# Patient Record
Sex: Male | Born: 1985
Health system: Southern US, Community
[De-identification: ages and names within clinical notes are randomized; demographics above are authoritative.]

## PROBLEM LIST (undated history)

## (undated) DIAGNOSIS — E119 Type 2 diabetes mellitus without complications: Secondary | ICD-10-CM

## (undated) DIAGNOSIS — I10 Essential (primary) hypertension: Secondary | ICD-10-CM

## (undated) HISTORY — DX: Essential (primary) hypertension: I10

## (undated) HISTORY — DX: Type 2 diabetes mellitus without complications: E11.9

## (undated) HISTORY — DX: Morbid (severe) obesity due to excess calories: E66.01

---
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
## 2013-08-18 ENCOUNTER — Ambulatory Visit (INDEPENDENT_AMBULATORY_CARE_PROVIDER_SITE_OTHER): Payer: BC Managed Care – PPO | Admitting: Family Medicine

## 2013-08-18 VITALS — BP 130/80 | HR 96 | Temp 99.0°F | Resp 16 | Ht 74.5 in | Wt 324.0 lb

## 2013-08-18 DIAGNOSIS — D649 Anemia, unspecified: Secondary | ICD-10-CM

## 2013-08-18 DIAGNOSIS — R112 Nausea with vomiting, unspecified: Secondary | ICD-10-CM

## 2013-08-18 DIAGNOSIS — E663 Overweight: Secondary | ICD-10-CM | POA: Insufficient documentation

## 2013-08-18 DIAGNOSIS — R6883 Chills (without fever): Secondary | ICD-10-CM

## 2013-08-18 DIAGNOSIS — R509 Fever, unspecified: Secondary | ICD-10-CM

## 2013-08-18 DIAGNOSIS — J029 Acute pharyngitis, unspecified: Secondary | ICD-10-CM

## 2013-08-18 LAB — FERRITIN: FERRITIN: 74 ng/mL (ref 22–322)

## 2013-08-18 LAB — COMPREHENSIVE METABOLIC PANEL
ALBUMIN: 4.1 g/dL (ref 3.5–5.2)
ALT: 98 U/L — ABNORMAL HIGH (ref 0–53)
AST: 85 U/L — AB (ref 0–37)
Alkaline Phosphatase: 71 U/L (ref 39–117)
BUN: 13 mg/dL (ref 6–23)
CO2: 25 meq/L (ref 19–32)
Calcium: 9 mg/dL (ref 8.4–10.5)
Chloride: 98 mEq/L (ref 96–112)
Creat: 1.12 mg/dL (ref 0.50–1.35)
GLUCOSE: 255 mg/dL — AB (ref 70–99)
POTASSIUM: 4.2 meq/L (ref 3.5–5.3)
SODIUM: 133 meq/L — AB (ref 135–145)
TOTAL PROTEIN: 7.1 g/dL (ref 6.0–8.3)
Total Bilirubin: 1 mg/dL (ref 0.3–1.2)

## 2013-08-18 LAB — POCT CBC
GRANULOCYTE PERCENT: 73.6 % (ref 37–80)
HCT, POC: 38.3 % — AB (ref 43.5–53.7)
Hemoglobin: 11.1 g/dL — AB (ref 14.1–18.1)
LYMPH, POC: 1.2 (ref 0.6–3.4)
MCH, POC: 17.4 pg — AB (ref 27–31.2)
MCHC: 29 g/dL — AB (ref 31.8–35.4)
MCV: 60.1 fL — AB (ref 80–97)
MID (CBC): 0.3 (ref 0–0.9)
PLATELET COUNT, POC: 231 10*3/uL (ref 142–424)
POC GRANULOCYTE: 4.2 (ref 2–6.9)
POC LYMPH %: 20.7 % (ref 10–50)
POC MID %: 5.7 % (ref 0–12)
RBC: 6.37 M/uL — AB (ref 4.69–6.13)
RDW, POC: 19.1 %
WBC: 5.7 10*3/uL (ref 4.6–10.2)

## 2013-08-18 LAB — POCT INFLUENZA A/B
INFLUENZA A, POC: NEGATIVE
INFLUENZA B, POC: NEGATIVE

## 2013-08-18 MED ORDER — ONDANSETRON HCL 8 MG PO TABS: 8.0000 mg | ORAL_TABLET | Freq: Three times a day (TID) | ORAL | Status: DC | PRN

## 2013-08-18 NOTE — Progress Notes (Addendum)
[facility] [address], [place] [id number] [phone]  Date:  08/18/2013   Name:  Peter HerterWandong Powell   DOB:  01/02/1986   MRN:  [id number]  PCP:  No primary provider on file.    Chief Complaint: Nausea   History of Present Illness:  Peter Powell is a 28 y.o. very pleasant male patient who presents with the following:  Ill for about 5 days- started with chills, fatigue, and fever.  He has noted just a mild dry cough.  Also, yesterday he developed nausea, vomiting and diarrhea.  He states that he has not had much of an appetite ever since the onset of his symptoms. However, he has been able to drink some water.  He did vomit several times yesterday none so far today.  He had a few bouts of diarrhea yesterday but also none today He is also complaining of associated diaphoresis, sore throat, rhinorrhea, diarrhea, mild cough at night, and mild left ear pain. Pt reports having at least 3 episodes of loose bowel before presenting to Urgent Care. He denies any abdominal pain.  Peter CornWandong is generally a healthy person.  He has never been a smoker.  He works at [employer].    There are no active problems to display for this patient.   No past medical history on file.  Past Surgical History  Procedure Laterality Date   Knee surgery Right 2001    screws placed below kneecap    History  Substance Use Topics   Smoking status: Never Smoker    Smokeless tobacco: Not on file   Alcohol Use: Not on file    No family history on file.  No Known Allergies  Medication list has been reviewed and updated.  No current outpatient prescriptions on file prior to visit.   No current facility-administered medications on file prior to visit.    Review of Systems:  As per HPI, otherwise negative.  Physical Examination: Filed Vitals:   08/18/13 1133  BP: 130/80  Pulse: 118  Temp: 100.2 F (37.9 C)  Resp: 16   @VITALS2 @ Body mass index is 41.06 kg/(m^2). Ideal Body  Weight: @FLOWAMB ([phone])@([phone])@  GEN: WDWN, NAD, Non-toxic, A & O x 3, large build HEENT: Atraumatic, Normocephalic. Neck supple. No masses, No LAD. Ears look normal except for some scarring at the TMs. Unable to do a throat swab due to strong gag reflex. Tried several times but he is absolutely unable to tolerate.  Visual exam does not show any exudate or particular erythema.   Ears and Nose: No external deformity. CV: RRR, No M/G/R. No JVD. No thrill. No extra heart sounds. PULM: CTA B, no wheezes, crackles, rhonchi. No retractions. No resp. distress. No accessory muscle use. ABD: S, NT, ND, +BS. No rebound. No HSM. Exam is benign EXTR: No c/c/e NEURO Normal gait.  PSYCH: Normally interactive. Conversant. Not depressed or anxious appearing.  Calm demeanor.   Results for orders placed in visit on 08/18/13  POCT CBC      Result Value Range   WBC 5.7  4.6 - 10.2 K/uL   Lymph, poc 1.2  0.6 - 3.4   POC LYMPH PERCENT 20.7  10 - 50 %L   MID (cbc) 0.3  0 - 0.9   POC MID % 5.7  0 - 12 %M   POC Granulocyte 4.2  2 - 6.9   Granulocyte percent 73.6  37 - 80 %G   RBC 6.37 (*) 4.69 -  6.13 M/uL   Hemoglobin 11.1 (*) 14.1 - 18.1 g/dL   HCT, POC 16.1 (*) 09.6 - 53.7 %   MCV 60.1 (*) 80 - 97 fL   MCH, POC 17.4 (*) 27 - 31.2 pg   MCHC 29.0 (*) 31.8 - 35.4 g/dL   RDW, POC 04.5     Platelet Count, POC 231  142 - 424 K/uL   MPV    0 - 99.8 fL  POCT INFLUENZA A/B      Result Value Range   Influenza A, POC Negative     Influenza B, POC Negative     Discussed starting an IV for hydration; he would like to do so.  Felt much better following 1 liter of IVF.  Pulse returned to normal.   Assessment and Plan: Fever, unspecified - Plan: POCT CBC  Chills - Plan: POCT Influenza A/B  Acute pharyngitis  Nausea and vomiting - Plan: ondansetron (ZOFRAN) 8 MG tablet, Comprehensive metabolic panel  Anemia, unspecified - Plan: POCT CBC, Ferritin  Suspect that Peter Powell has a viral illness.  His visual throat  exam and normal CBC are reassuring that this is not strep. Encouraged rest, hydration, and zofran as needed for nausea.  Will check a CMP.  OOW until sx resolve.   Will check ferritin today as he has microcytic anemia  Signed Abbe Amsterdam, MD  Called 9:30 am on 1/3 to discuss labs urgently. No answer, no VM.  Will try back Reached him at 10:30; asked him to come back in today because we need to look at possible diabetes and test him for acute hepatitis. He agreed to do so

## 2013-08-18 NOTE — Patient Instructions (Signed)
You may have the flu or another viral illness.  Use the zofran as needed for nausea.  Work on drinking plenty of fluids and rest.   If you are not feeling better in the next few days please let me know- sooner if you are worse.   As we discussed you are a bit anemic on today's labs.  Let's recheck this in one month; you can just come by the clinic for a lab visit only to have this done.    I also checked your liver and kidney labs today and will let you know as soon as these results come in.

## 2013-08-19 ENCOUNTER — Other Ambulatory Visit: Payer: Self-pay | Admitting: Family Medicine

## 2013-08-19 ENCOUNTER — Ambulatory Visit (INDEPENDENT_AMBULATORY_CARE_PROVIDER_SITE_OTHER): Payer: BC Managed Care – PPO | Admitting: Family Medicine

## 2013-08-19 VITALS — BP 110/72 | HR 62 | Temp 98.9°F | Resp 18 | Ht 75.5 in | Wt 319.8 lb

## 2013-08-19 DIAGNOSIS — R7309 Other abnormal glucose: Secondary | ICD-10-CM

## 2013-08-19 DIAGNOSIS — R509 Fever, unspecified: Secondary | ICD-10-CM

## 2013-08-19 DIAGNOSIS — D649 Anemia, unspecified: Secondary | ICD-10-CM

## 2013-08-19 DIAGNOSIS — E860 Dehydration: Secondary | ICD-10-CM

## 2013-08-19 DIAGNOSIS — R748 Abnormal levels of other serum enzymes: Secondary | ICD-10-CM

## 2013-08-19 DIAGNOSIS — E119 Type 2 diabetes mellitus without complications: Secondary | ICD-10-CM | POA: Insufficient documentation

## 2013-08-19 LAB — POCT URINALYSIS DIPSTICK
GLUCOSE UA: 500
Glucose, UA: 500
KETONES UA: 80
Ketones, UA: 80
LEUKOCYTES UA: NEGATIVE
Leukocytes, UA: NEGATIVE
NITRITE UA: NEGATIVE
Nitrite, UA: NEGATIVE
PH UA: 5.5
PH UA: 6
PROTEIN UA: 100
RBC UA: NEGATIVE
RBC UA: NEGATIVE
SPEC GRAV UA: 1.02
SPEC GRAV UA: 1.025
Urobilinogen, UA: >=8
Urobilinogen, UA: >=8

## 2013-08-19 LAB — COMPREHENSIVE METABOLIC PANEL
ALBUMIN: 3.9 g/dL (ref 3.5–5.2)
ALK PHOS: 69 U/L (ref 39–117)
ALT: 155 U/L — ABNORMAL HIGH (ref 0–53)
AST: 155 U/L — AB (ref 0–37)
BILIRUBIN TOTAL: 1.1 mg/dL (ref 0.3–1.2)
BUN: 11 mg/dL (ref 6–23)
CALCIUM: 8.8 mg/dL (ref 8.4–10.5)
CO2: 26 mEq/L (ref 19–32)
CREATININE: 1.08 mg/dL (ref 0.50–1.35)
Chloride: 97 mEq/L (ref 96–112)
Glucose, Bld: 245 mg/dL — ABNORMAL HIGH (ref 70–99)
Potassium: 4.1 mEq/L (ref 3.5–5.3)
Sodium: 131 mEq/L — ABNORMAL LOW (ref 135–145)
Total Protein: 6.8 g/dL (ref 6.0–8.3)

## 2013-08-19 LAB — POCT CBC
Granulocyte percent: 73.3 %G (ref 37–80)
HCT, POC: 38.4 % — AB (ref 43.5–53.7)
Hemoglobin: 11.2 g/dL — AB (ref 14.1–18.1)
Lymph, poc: 1 (ref 0.6–3.4)
MCH, POC: 17.5 pg — AB (ref 27–31.2)
MCHC: 29.2 g/dL — AB (ref 31.8–35.4)
MCV: 60 fL — AB (ref 80–97)
MID (cbc): 0.1 (ref 0–0.9)
PLATELET COUNT, POC: 160 10*3/uL (ref 142–424)
POC Granulocyte: 3 (ref 2–6.9)
POC LYMPH PERCENT: 23.2 %L (ref 10–50)
POC MID %: 3.5 % (ref 0–12)
RBC: 6.4 M/uL — AB (ref 4.69–6.13)
RDW, POC: 19.3 %
WBC: 4.1 10*3/uL — AB (ref 4.6–10.2)

## 2013-08-19 LAB — GLUCOSE, POCT (MANUAL RESULT ENTRY)
POC GLUCOSE: 269 mg/dL — AB (ref 70–99)
POC Glucose: 244 mg/dl — AB (ref 70–99)

## 2013-08-19 LAB — POCT GLYCOSYLATED HEMOGLOBIN (HGB A1C): HEMOGLOBIN A1C: 11.3

## 2013-08-19 MED ORDER — INSULIN GLARGINE 100 UNIT/ML ~~LOC~~ SOLN
10.0000 [IU] | Freq: Once | SUBCUTANEOUS | Status: AC
Start: 2013-08-19 — End: 2013-08-19
  Administered 2013-08-19: 10 [IU] via SUBCUTANEOUS

## 2013-08-19 MED ORDER — METFORMIN HCL 500 MG PO TABS: 500.0000 mg | ORAL_TABLET | Freq: Two times a day (BID) | ORAL | Status: DC

## 2013-08-19 MED ORDER — IBUPROFEN 200 MG PO TABS
400.0000 mg | ORAL_TABLET | Freq: Once | ORAL | Status: AC
  Administered 2013-08-19: 400 mg via ORAL

## 2013-08-19 NOTE — Progress Notes (Addendum)
[facility]gent [facility] [address], [name] [address] [phone]  Date:  08/19/2013   Name:  Peter Powell   DOB:  October 08, 1985   MRN:  [id number]  PCP:  No primary provider on file.    Chief Complaint: Follow-up   History of Present Illness:  Fox Salminen is a 28 y.o. very pleasant male patient who presents with the following:  Here today to follow-up illness.  Seen yesterday with apparent GI illness.  He felt better after a liter of IVF and was able to go home.  However overnight his CMP showed elevated glucose and also elevated liver enzymes.  Asked him to come back in to today and he agreed.    He reports that last night he could not sleep due to cramping in his legs No abdominal pain.  He has not had any further vomiting He had a normal stool today, no diarrhea.    He was not aware that he had DM and has never been told that his sugar was high.  He does not have a familiy history of DM as far as he knows.    He was able to drink today- just a little bit.  He has not been abe to eat howver.    Patient Active Problem List   Diagnosis Date Noted  . Overweight 08/18/2013    No past medical history on file.  Past Surgical History  Procedure Laterality Date  . Knee surgery Right 2001    screws placed below kneecap    History  Substance Use Topics  . Smoking status: Never Smoker   . Smokeless tobacco: Not on file  . Alcohol Use: Not on file    No family history on file.  No Known Allergies  Medication list has been reviewed and updated.  Current Outpatient Prescriptions on File Prior to Visit  Medication Sig Dispense Refill  . ondansetron (ZOFRAN) 8 MG tablet Take 1 tablet (8 mg total) by mouth every 8 (eight) hours as needed for nausea or vomiting.  15 tablet  0   No current facility-administered medications on file prior to visit.    Review of Systems:  As per HPI- otherwise negative.   Physical Examination: Filed Vitals:   08/19/13 1230   BP: 120/72  Pulse: 122  Temp: 100.6 F (38.1 C)  Resp: 18   Filed Vitals:   08/19/13 1230  Height: 6' 3.5" (1.918 m)  Weight: 319 lb 12.8 oz (145.06 kg)   Body mass index is 39.43 kg/(m^2). Ideal Body Weight: Weight in (lb) to have BMI = 25: 202.3  GEN: WDWN, NAD, Non-toxic, A & O x 3, obese, mild fever HEENT: Atraumatic, Normocephalic. Neck supple. No masses, No LAD. Ears and Nose: No external deformity. CV: RRR, No M/G/R. No JVD. No thrill. No extra heart sounds. PULM: CTA B, no wheezes, crackles, rhonchi. No retractions. No resp. distress. No accessory muscle use. ABD: S, NT, ND, +BS. No rebound. No HSM. EXTR: No c/c/e NEURO Normal gait.  PSYCH: Normally interactive. Conversant. Not depressed or anxious appearing.  Calm demeanor.   Results for orders placed in visit on 08/19/13  POCT GLYCOSYLATED HEMOGLOBIN (HGB A1C)      Result Value Range   Hemoglobin A1C 11.3    GLUCOSE, POCT (MANUAL RESULT ENTRY)      Result Value Range   POC Glucose 269 (*) 70 - 99 mg/dl  POCT URINALYSIS DIPSTICK      Result Value Range  Color, UA orange     Clarity, UA clear     Glucose, UA 500     Bilirubin, UA mod     Ketones, UA 80     Spec Grav, UA 1.020     Blood, UA neg     pH, UA 5.5     Protein, UA >300     Urobilinogen, UA >=8.0     Nitrite, UA neg     Leukocytes, UA Negative    POCT CBC      Result Value Range   WBC 4.1 (*) 4.6 - 10.2 K/uL   Lymph, poc 1.0  0.6 - 3.4   POC LYMPH PERCENT 23.2  10 - 50 %L   MID (cbc) 0.1  0 - 0.9   POC MID % 3.5  0 - 12 %M   POC Granulocyte 3.0  2 - 6.9   Granulocyte percent 73.3  37 - 80 %G   RBC 6.40 (*) 4.69 - 6.13 M/uL   Hemoglobin 11.2 (*) 14.1 - 18.1 g/dL   HCT, POC 16.138.4 (*) 09.643.5 - 53.7 %   MCV 60.0 (*) 80 - 97 fL   MCH, POC 17.5 (*) 27 - 31.2 pg   MCHC 29.2 (*) 31.8 - 35.4 g/dL   RDW, POC 04.519.3     Platelet Count, POC 160  142 - 424 K/uL   MPV    0 - 99.8 fL  GLUCOSE, POCT (MANUAL RESULT ENTRY)      Result Value Range   POC  Glucose 244 (*) 70 - 99 mg/dl  POCT URINALYSIS DIPSTICK      Result Value Range   Color, UA orange     Clarity, UA cloudy     Glucose, UA 500     Bilirubin, UA mod     Ketones, UA 80     Spec Grav, UA 1.025     Blood, UA neg     pH, UA 6.0     Protein, UA 100     Urobilinogen, UA >=8.0     Nitrite, UA neg     Leukocytes, UA Negative     Given 2 liters of IV saline and 10 units of lantus.  Also 400 mg of po ibuprofen. He felt much better, VS improved.  He wished to go home.  Repeat UA and glucose as per second set of labs above.    Assessment and Plan: Elevated glucose - Plan: POCT glycosylated hemoglobin (Hb A1C), POCT glucose (manual entry), POCT urinalysis dipstick  Elevated liver enzymes - Plan: Comprehensive metabolic panel, Acute Hep Panel & Hep B Surface Ab  Diabetes mellitus, type 2 - Plan: insulin glargine (LANTUS) injection 10 Units, POCT glucose (manual entry), POCT urinalysis dipstick, metFORMIN (GLUCOPHAGE) 500 MG tablet  Fever, unspecified - Plan: ibuprofen (ADVIL,MOTRIN) tablet 400 mg  Anemia, unspecified - Plan: POCT CBC  Long discussion with pt today.  He is ill and also has newly diagnosed DM.  We need to treat his condition with care as he is at increased risk of dehydration.   Will plan to start on oral therapy for DM.  He may need to go to insulin but will try metformin first.  He is likely to need additional dietary potassium to prevent hypokalemia as his hyperglycemia is corrected.  Gave a hand- out detailing potassium rich foods.     Plan close follow-up See patient instructions for more details.   Will nee to get his acute hep panel back  before he returns to work.   Advised ibuprofen NOT tylenol given his elevated liver enzymes  Signed Abbe Amsterdam, MD  Called on 1/4: he is doing ok, is not vomiting or having diarrhea and did eat a little bit.  No fever.  He will come and see me tomorrow for a recheck. However if he is getting worse again today  he will come to see [place] at clinic or go to the ER Add on CK, PT INR and EBV if possible.

## 2013-08-19 NOTE — Patient Instructions (Signed)
You do have diabetes.  It is important for us to get your blood sugar under control.  Please eat a diet lower in sugar/ carbohydrates and higher in vegetables.  Exercise and weight loss are also important.   We are going to start you on metformin for your diabetes.  Take one before bed for the next week, then increase to twice a day.    Drink plenty of water.  Also, some potassium would be a good idea.  You can get potassium in foods as per my handout.     I will call you in the next couple of days with your labs and to check on you.  If you are NOT doing ok or are continuing to run a fever please let me know right away!    Get a glucose meter and start checking your sugar prior to eating in the morning.  Write this number down.   Look at the ADA (american diabetes association) website for lots of good information

## 2013-08-20 ENCOUNTER — Encounter (HOSPITAL_COMMUNITY): Payer: Self-pay | Admitting: Emergency Medicine

## 2013-08-20 ENCOUNTER — Emergency Department (HOSPITAL_COMMUNITY)
Admission: EM | Admit: 2013-08-20 | Discharge: 2013-08-20 | Disposition: A | Discharge status: Home / Self Care | Payer: BC Managed Care – PPO | Attending: Emergency Medicine | Admitting: Emergency Medicine

## 2013-08-20 ENCOUNTER — Emergency Department (HOSPITAL_COMMUNITY): Payer: BC Managed Care – PPO

## 2013-08-20 DIAGNOSIS — R69 Illness, unspecified: Secondary | ICD-10-CM

## 2013-08-20 DIAGNOSIS — Z79899 Other long term (current) drug therapy: Secondary | ICD-10-CM | POA: Insufficient documentation

## 2013-08-20 DIAGNOSIS — J111 Influenza due to unidentified influenza virus with other respiratory manifestations: Secondary | ICD-10-CM

## 2013-08-20 DIAGNOSIS — E119 Type 2 diabetes mellitus without complications: Secondary | ICD-10-CM | POA: Insufficient documentation

## 2013-08-20 HISTORY — DX: Type 2 diabetes mellitus without complications: E11.9

## 2013-08-20 LAB — URINALYSIS, ROUTINE W REFLEX MICROSCOPIC
Glucose, UA: 250 mg/dL — AB
Hgb urine dipstick: NEGATIVE
Ketones, ur: >80 mg/dL — AB
Nitrite: NEGATIVE
Protein, ur: 30 mg/dL — AB
Specific Gravity, Urine: 1.025 (ref 1.005–1.030)
Urobilinogen, UA: 2 mg/dL — ABNORMAL HIGH (ref 0.0–1.0)
pH: 6 (ref 5.0–8.0)

## 2013-08-20 LAB — CBC WITH DIFFERENTIAL/PLATELET
Basophils Absolute: 0 10*3/uL (ref 0.0–0.1)
Basophils Relative: 2 % — ABNORMAL HIGH (ref 0–1)
Eosinophils Absolute: 0 10*3/uL (ref 0.0–0.7)
Eosinophils Relative: 0 % (ref 0–5)
HCT: 37.2 % — ABNORMAL LOW (ref 39.0–52.0)
Hemoglobin: 11.2 g/dL — ABNORMAL LOW (ref 13.0–17.0)
Lymphocytes Relative: 19 % (ref 12–46)
Lymphs Abs: 0.4 10*3/uL — ABNORMAL LOW (ref 0.7–4.0)
MCH: 17.1 pg — ABNORMAL LOW (ref 26.0–34.0)
MCHC: 30.1 g/dL (ref 30.0–36.0)
MCV: 56.8 fL — ABNORMAL LOW (ref 78.0–100.0)
Monocytes Absolute: 0.2 10*3/uL (ref 0.1–1.0)
Monocytes Relative: 7 % (ref 3–12)
Neutro Abs: 1.6 10*3/uL — ABNORMAL LOW (ref 1.7–7.7)
Neutrophils Relative %: 72 % (ref 43–77)
Platelets: 121 10*3/uL — ABNORMAL LOW (ref 150–400)
RBC: 6.55 MIL/uL — ABNORMAL HIGH (ref 4.22–5.81)
RDW: 17.6 % — ABNORMAL HIGH (ref 11.5–15.5)
WBC: 2.3 10*3/uL — ABNORMAL LOW (ref 4.0–10.5)

## 2013-08-20 LAB — ACUTE HEP PANEL AND HEP B SURFACE AB
HCV Ab: NEGATIVE
HEP B S AB: POSITIVE — AB
Hep A IgM: NONREACTIVE
Hep B C IgM: NONREACTIVE
Hepatitis B Surface Ag: NEGATIVE

## 2013-08-20 LAB — COMPREHENSIVE METABOLIC PANEL
ALT: 202 U/L — ABNORMAL HIGH (ref 0–53)
AST: 224 U/L — ABNORMAL HIGH (ref 0–37)
Albumin: 3.6 g/dL (ref 3.5–5.2)
Alkaline Phosphatase: 87 U/L (ref 39–117)
BUN: 11 mg/dL (ref 6–23)
CO2: 22 mEq/L (ref 19–32)
Calcium: 8.9 mg/dL (ref 8.4–10.5)
Chloride: 94 mEq/L — ABNORMAL LOW (ref 96–112)
Creatinine, Ser: 0.89 mg/dL (ref 0.50–1.35)
GFR calc Af Amer: >90 mL/min (ref >90)
GFR calc non Af Amer: >90 mL/min (ref >90)
Glucose, Bld: 201 mg/dL — ABNORMAL HIGH (ref 70–99)
Potassium: 4.3 mEq/L (ref 3.7–5.3)
Sodium: 132 mEq/L — ABNORMAL LOW (ref 137–147)
Total Bilirubin: 1.2 mg/dL (ref 0.3–1.2)
Total Protein: 7.4 g/dL (ref 6.0–8.3)

## 2013-08-20 LAB — GLUCOSE, CAPILLARY
GLUCOSE-CAPILLARY: 185 mg/dL — AB (ref 70–99)
GLUCOSE-CAPILLARY: 173 mg/dL — AB (ref 70–99)

## 2013-08-20 LAB — BLOOD GAS, VENOUS
Acid-Base Excess: 0.1 mmol/L (ref 0.0–2.0)
Bicarbonate: 24.8 mEq/L — ABNORMAL HIGH (ref 20.0–24.0)
FIO2: 0.21 %
O2 Saturation: 50.8 %
Patient temperature: 98.6
TCO2: 23.2 mmol/L (ref 0–100)
pCO2, Ven: 43.3 mmHg — ABNORMAL LOW (ref 45.0–50.0)
pH, Ven: 7.377 — ABNORMAL HIGH (ref 7.250–7.300)
pO2, Ven: 30.4 mmHg (ref 30.0–45.0)

## 2013-08-20 LAB — URINE MICROSCOPIC-ADD ON

## 2013-08-20 MED ORDER — ONDANSETRON HCL 4 MG/2ML IJ SOLN
4.0000 mg | Freq: Once | INTRAMUSCULAR | Status: AC
  Administered 2013-08-20: 4 mg via INTRAVENOUS
  Filled 2013-08-20: qty 2

## 2013-08-20 MED ORDER — SODIUM CHLORIDE 0.9 % IV BOLUS (SEPSIS)
2000.0000 mL | Freq: Once | INTRAVENOUS | Status: AC
  Administered 2013-08-20: 2000 mL via INTRAVENOUS

## 2013-08-20 MED ORDER — PROMETHAZINE HCL 25 MG PO TABS: 25.0000 mg | ORAL_TABLET | Freq: Three times a day (TID) | ORAL | Status: DC | PRN

## 2013-08-20 NOTE — ED Notes (Signed)
Pt received call from UC and was told to come here because he was sweating. Pt newly dx diabetic, has had stomach upset for past few days. States he has nausea, but no vomiting.

## 2013-08-20 NOTE — Discharge Instructions (Signed)
Followup with the clinic provided.  Return here as needed. slowly increase your fluid intake

## 2013-08-20 NOTE — ED Provider Notes (Signed)
CSN: [id number]     Arrival date & time 08/20/13  1419 History   First MD Initiated Contact with Patient 08/20/13 1513     Chief Complaint  Patient presents with  . Nausea   (Consider location/radiation/quality/duration/timing/severity/associated sxs/prior Treatment) HPI Patient presents emergency department with body aches, cough, nausea, fever, and sore throat.  The patient, states, that he found out that he was recently a new onset diabetic.  Patient, states, that he doesn't have any chest pain, shortness of breath, abdominal pain, back pain, headache, blurred vision, weakness, dizziness, lethargy, or syncope.  The patient, states, that he did not take any medications although once he was prescribed.  The patient  states, that he's been sweating over the last few hours, and that's what brings him to the emergency room Past Medical History  Diagnosis Date  . Diabetes mellitus without complication    Past Surgical History  Procedure Laterality Date  . Knee surgery Right 2001    screws placed below kneecap   History reviewed. No pertinent family history. History  Substance Use Topics  . Smoking status: Never Smoker   . Smokeless tobacco: Not on file  . Alcohol Use: Not on file    Review of Systems All other systems negative except as documented in the HPI. All pertinent positives and negatives as reviewed in the HPI. Allergies  Review of patient's allergies indicates no known allergies.  Home Medications   Current Outpatient Rx  Name  Route  Sig  Dispense  Refill  . ibuprofen (ADVIL,MOTRIN) 200 MG tablet   Oral   Take 400 mg by mouth every 6 (six) hours as needed.         . metFORMIN (GLUCOPHAGE) 500 MG tablet   Oral   Take 500 mg by mouth 2 (two) times daily with a meal. Start with one pill before bed for the first week         . ondansetron (ZOFRAN) 8 MG tablet   Oral   Take 8 mg by mouth every 8 (eight) hours as needed for nausea or vomiting.          BP  131/83  Pulse 106  Temp(Src) 98 F (36.7 C) (Oral)  Resp 20  SpO2 100% Physical Exam  Nursing note and vitals reviewed. Constitutional: He is oriented to person, place, and time. He appears well-developed and well-nourished. No distress.  HENT:  Head: Normocephalic and atraumatic.  Mouth/Throat: Oropharynx is clear and moist.  Eyes: Pupils are equal, round, and reactive to light.  Neck: Normal range of motion. Neck supple.  Cardiovascular: Normal rate, regular rhythm and normal heart sounds.  Exam reveals no gallop and no friction rub.   No murmur heard. Pulmonary/Chest: Effort normal. No respiratory distress.  Abdominal: Soft. Bowel sounds are normal. There is no tenderness. There is no rebound and no guarding.  Neurological: He is alert and oriented to person, place, and time. No cranial nerve deficit. He exhibits normal muscle tone. Coordination normal.  Skin: Skin is warm and dry. No rash noted. No erythema.    ED Course  Procedures (including critical care time) Labs Review Labs Reviewed  GLUCOSE, CAPILLARY - Abnormal; Notable for the following:    Glucose-Capillary 185 (*)    All other components within normal limits  COMPREHENSIVE METABOLIC PANEL - Abnormal; Notable for the following:    Sodium 132 (*)    Chloride 94 (*)    Glucose, Bld 201 (*)    AST 224 (*)  ALT 202 (*)    All other components within normal limits  CBC WITH DIFFERENTIAL - Abnormal; Notable for the following:    WBC 2.3 (*)    RBC 6.55 (*)    Hemoglobin 11.2 (*)    HCT 37.2 (*)    MCV 56.8 (*)    MCH 17.1 (*)    RDW 17.6 (*)    Platelets 121 (*)    Neutro Abs 1.6 (*)    Lymphs Abs 0.4 (*)    Basophils Relative 2 (*)    All other components within normal limits  URINALYSIS, ROUTINE W REFLEX MICROSCOPIC   Imaging Review Dg Chest 2 View  08/20/2013   CLINICAL DATA:  Cough.  EXAM: CHEST  2 VIEW  COMPARISON:  12/24/2003.  FINDINGS: The cardiac silhouette, mediastinal and hilar contours are  normal and stable. There is peribronchial thickening and slight increased interstitial markings suggesting bronchitis. No infiltrates or effusions. The bony thorax is intact.  IMPRESSION: Findings suggest bronchitis.  No focal infiltrates.   Electronically Signed   By: Loralie Champagne M.D.   On: 08/20/2013 16:18   Patient has improved following IV fluids and has tolerated oral fluids.  Is not in DKA, at this time.  Patient has improved, in the emergency department and his vital signs have improved.  Told to return here as needed.  Given him a referral to the wellness Center as well.  Told to slowly increase his fluid intake    Carlyle Dolly, PA-C 08/21/13 0041

## 2013-08-20 NOTE — ED Notes (Signed)
Pt did not present to ED on 1/4 with a peripheral IV.

## 2013-08-21 LAB — CK: CK TOTAL: 150 U/L (ref 7–232)

## 2013-08-22 LAB — EPSTEIN-BARR VIRUS VCA ANTIBODY PANEL
EBV NA IGG: 154 U/mL — AB (ref <18.0)
EBV VCA IgG: 82.3 U/mL — ABNORMAL HIGH (ref <18.0)

## 2013-08-23 ENCOUNTER — Ambulatory Visit: Payer: BC Managed Care – PPO

## 2013-08-23 ENCOUNTER — Ambulatory Visit (INDEPENDENT_AMBULATORY_CARE_PROVIDER_SITE_OTHER): Payer: BC Managed Care – PPO | Admitting: Family Medicine

## 2013-08-23 VITALS — BP 120/72 | HR 90 | Temp 98.0°F | Resp 18 | Ht 76.0 in | Wt 317.0 lb

## 2013-08-23 DIAGNOSIS — D72819 Decreased white blood cell count, unspecified: Secondary | ICD-10-CM

## 2013-08-23 DIAGNOSIS — M25561 Pain in right knee: Secondary | ICD-10-CM

## 2013-08-23 DIAGNOSIS — R748 Abnormal levels of other serum enzymes: Secondary | ICD-10-CM

## 2013-08-23 DIAGNOSIS — M25569 Pain in unspecified knee: Secondary | ICD-10-CM

## 2013-08-23 LAB — COMPREHENSIVE METABOLIC PANEL
ALBUMIN: 3.7 g/dL (ref 3.5–5.2)
ALK PHOS: 138 U/L — AB (ref 39–117)
ALT: 166 U/L — ABNORMAL HIGH (ref 0–53)
AST: 122 U/L — AB (ref 0–37)
BILIRUBIN TOTAL: 0.9 mg/dL (ref 0.3–1.2)
BUN: 9 mg/dL (ref 6–23)
CALCIUM: 9.2 mg/dL (ref 8.4–10.5)
CO2: 27 mEq/L (ref 19–32)
Chloride: 97 mEq/L (ref 96–112)
Creat: 0.82 mg/dL (ref 0.50–1.35)
Glucose, Bld: 153 mg/dL — ABNORMAL HIGH (ref 70–99)
Potassium: 4.1 mEq/L (ref 3.5–5.3)
Sodium: 132 mEq/L — ABNORMAL LOW (ref 135–145)
TOTAL PROTEIN: 6.9 g/dL (ref 6.0–8.3)

## 2013-08-23 LAB — POCT CBC
Granulocyte percent: 49.8 %G (ref 37–80)
HCT, POC: 33.2 % — AB (ref 43.5–53.7)
Hemoglobin: 11 g/dL — AB (ref 14.1–18.1)
LYMPH, POC: 3.1 (ref 0.6–3.4)
MCH: 17.1 pg — AB (ref 27–31.2)
MCHC: 28.9 g/dL — AB (ref 31.8–35.4)
MCV: 58.9 fL — AB (ref 80–97)
MID (cbc): 0.8 (ref 0–0.9)
PLATELET COUNT, POC: 182 10*3/uL (ref 142–424)
POC GRANULOCYTE: 3.9 (ref 2–6.9)
POC LYMPH PERCENT: 39.5 %L (ref 10–50)
POC MID %: 10.7 %M (ref 0–12)
RBC: 5.63 M/uL (ref 4.69–6.13)
RDW, POC: 20.1 %
WBC: 7.8 10*3/uL (ref 4.6–10.2)

## 2013-08-23 NOTE — ED Provider Notes (Signed)
Medical screening examination/treatment/procedure(s) were performed by non-physician practitioner and as supervising physician I was immediately available for consultation/collaboration.    Peter KrasJon R Sundi Slevin, MD 08/23/13 [phone]

## 2013-08-23 NOTE — Progress Notes (Addendum)
Urgent Medical and [facility] [address], [name] [address] [phone]  Date:  08/23/2013   Name:  Peter Powell   DOB:  06/10/1986   MRN:  [id number]  PCP:  No primary provider on file.    Chief Complaint: Follow-up   History of Present Illness:  Peter Powell is a 28 y.o. very pleasant male patient who presents with the following:  Here today for a recheck of recent illness with elelvated transaminases and new dx of DM.  Here today for a recheck.  He has been seen here twice and then went to the ER 2 days ago.  He is doing better overall he thinks.  He is eating again.   He has not noted a fever but is taking ibuprofen still So far today he ate an egg and some green beans.  He has had water and milk to drink.   No vomiting or diarrhea.    He has also noted pain in his right leg/ knee for the last 3 or 4 weeks.  He hurts in the knee and behind the knee.   He had surgery about 15 years ago for an injury in which he "chipped my knee."  He fell and then had to have a surgical repair and pins.  He is not aware of any new injury to his knee. However he has pain with walking and has to hold his knee as straight as possible to walk,    Patient Active Problem List   Diagnosis Date Noted  . Diabetes mellitus, type 2 08/19/2013  . Overweight 08/18/2013    Past Medical History  Diagnosis Date  . Diabetes mellitus without complication     Past Surgical History  Procedure Laterality Date  . Knee surgery Right 2001    screws placed below kneecap    History  Substance Use Topics  . Smoking status: Never Smoker   . Smokeless tobacco: Not on file  . Alcohol Use: Not on file    History reviewed. No pertinent family history.  No Known Allergies  Medication list has been reviewed and updated.  Current Outpatient Prescriptions on File Prior to Visit  Medication Sig Dispense Refill  . ibuprofen (ADVIL,MOTRIN) 200 MG tablet Take 400 mg by mouth every 6 (six) hours as needed.       . metFORMIN (GLUCOPHAGE) 500 MG tablet Take 500 mg by mouth 2 (two) times daily with a meal. Start with one pill before bed for the first week      . ondansetron (ZOFRAN) 8 MG tablet Take 8 mg by mouth every 8 (eight) hours as needed for nausea or vomiting.      . promethazine (PHENERGAN) 25 MG tablet Take 1 tablet (25 mg total) by mouth every 8 (eight) hours as needed for nausea or vomiting.  15 tablet  0   No current facility-administered medications on file prior to visit.    Review of Systems:  As per HPI- otherwise negative.   Physical Examination: Filed Vitals:   08/23/13 1320  BP: 120/72  Pulse: 119  Temp: 98 F (36.7 C)  Resp: 18   Filed Vitals:   08/23/13 1320  Height: 6\' 4"  (1.93 m)  Weight: 317 lb (143.79 kg)   Body mass index is 38.6 kg/(m^2). Ideal Body Weight: Weight in (lb) to have BMI = 25: 205  GEN: WDWN, NAD, Non-toxic, A & O x 3, obese, looks well HEENT: Atraumatic, Normocephalic. Neck supple. No masses, No LAD.  Ears and Nose: No external deformity. CV: RRR, No M/G/R. No JVD. No thrill. No extra heart sounds. PULM: CTA B, no wheezes, crackles, rhonchi. No retractions. No resp. distress. No accessory muscle use. ABD: S, NT, ND, +BS. No rebound. No HSM. EXTR: No c/c/e NEURO Normal gait. He holds knee straight to walk but this is not apparent from observing his gait.   PSYCH: Normally interactive. Conversant. Not depressed or anxious appearing.  Calm demeanor.  Right knee: he has an anterior scar from past operation.   He has pain with flexion and extension of his knee.  No effusion, knee is stable.  No heat or redness.    Results for orders placed in visit on 08/23/13  POCT CBC      Result Value Range   WBC 7.8  4.6 - 10.2 K/uL   Lymph, poc 3.1  0.6 - 3.4   POC LYMPH PERCENT 39.5  10 - 50 %L   MID (cbc) 0.8  0 - 0.9   POC MID % 10.7  0 - 12 %M   POC Granulocyte 3.9  2 - 6.9   Granulocyte percent 49.8  37 - 80 %G   RBC 5.63  4.69 - 6.13 M/uL    Hemoglobin 11.0 (*) 14.1 - 18.1 g/dL   HCT, POC 16.133.2 (*) 09.643.5 - 53.7 %   MCV 58.9 (*) 80 - 97 fL   MCH, POC 17.1 (*) 27 - 31.2 pg   MCHC 28.9 (*) 31.8 - 35.4 g/dL   RDW, POC 04.520.1     Platelet Count, POC 182  142 - 424 K/uL   MPV    0 - 99.8 fL    [facility] reading (PRIMARY) by  Dr. Patsy Lager[place]. Right knee: post- surgical changes.  Degenerative change and possible loose body   RIGHT KNEE - COMPLETE 4+ VIEW  COMPARISON: None.  FINDINGS: There is tricompartmental osteoarthritis with a slight lateral patellar subluxation and tilt. There are tricompartmental marginal osteophytes, most prominent in the lateral compartment. Incidental note is made of a fabella.  Small joint effusion.  The patient has 4 screws in the proximal tibia.  IMPRESSION: Tricompartmental osteoarthritis. Small joint effusion.  Assessment and Plan: Elevated liver enzymes - Plan: Comprehensive metabolic panel  Leukopenia - Plan: POCT CBC  Knee pain, acute, right - Plan: DG Knee Complete 4 Views Right  luekopenia is resolved.  Await other labs and will call him He is on treatment for his DM with metformin Will send to ortho to evaluate his knee.  We did not have a knee brace to fit him unfortunately.   appt for next week at [facility]  Signed Abbe AmsterdamJessica Harun Brumley, MD  Called and spoke with him on 1/8: his labs are going in the right direction  Results for orders placed in visit on 08/23/13  COMPREHENSIVE METABOLIC PANEL      Result Value Range   Sodium 132 (*) 135 - 145 mEq/L   Potassium 4.1  3.5 - 5.3 mEq/L   Chloride 97  96 - 112 mEq/L   CO2 27  19 - 32 mEq/L   Glucose, Bld 153 (*) 70 - 99 mg/dL   BUN 9  6 - 23 mg/dL   Creat 4.090.82  8.110.50 - 9.141.35 mg/dL   Total Bilirubin 0.9  0.3 - 1.2 mg/dL   Alkaline Phosphatase 138 (*) 39 - 117 U/L   AST 122 (*) 0 - 37 U/L   ALT 166 (*) 0 - 53 U/L  Total Protein 6.9  6.0 - 8.3 g/dL   Albumin 3.7  3.5 - 5.2 g/dL   Calcium 9.2  8.4 - 16.1 mg/dL  POCT CBC      Result  Value Range   WBC 7.8  4.6 - 10.2 K/uL   Lymph, poc 3.1  0.6 - 3.4   POC LYMPH PERCENT 39.5  10 - 50 %L   MID (cbc) 0.8  0 - 0.9   POC MID % 10.7  0 - 12 %M   POC Granulocyte 3.9  2 - 6.9   Granulocyte percent 49.8  37 - 80 %G   RBC 5.63  4.69 - 6.13 M/uL   Hemoglobin 11.0 (*) 14.1 - 18.1 g/dL   HCT, POC 09.6 (*) 04.5 - 53.7 %   MCV 58.9 (*) 80 - 97 fL   MCH, POC 17.1 (*) 27 - 31.2 pg   MCHC 28.9 (*) 31.8 - 35.4 g/dL   RDW, POC 40.9     Platelet Count, POC 182  142 - 424 K/uL   MPV    0 - 99.8 fL   Plan to set up an abdominal ultrasound to look at his liver (?fatty liver) and recheck labs as a lab visit only in 2 or 3 weeks.  He reports that he is feeling well.

## 2013-08-23 NOTE — Patient Instructions (Signed)
I will give you a call with your labs later today.   We will get you in to see orthopedics about your knee.   Let me know if you are getting worse!

## 2013-08-25 ENCOUNTER — Encounter: Payer: Self-pay | Admitting: Family Medicine

## 2013-08-25 NOTE — Addendum Note (Signed)
Addended by: Abbe AmsterdamOPLAND, JESSICA C on: 08/25/2013 07:59 AM   Modules accepted: Orders

## 2013-08-31 ENCOUNTER — Telehealth: Payer: Self-pay | Admitting: Family Medicine

## 2013-08-31 NOTE — Telephone Encounter (Signed)
Called to check on him.  He is feeling well.  Ultrasound is tomorrow.  Asked him to please come by for repeat labs next week and he agreed.  A lab visit only is ok

## 2013-09-01 ENCOUNTER — Ambulatory Visit
Admission: RE | Admit: 2013-09-01 | Discharge: 2013-09-01 | Disposition: A | Discharge status: Home / Self Care | Payer: BC Managed Care – PPO | Source: Ambulatory Visit | Attending: Family Medicine | Admitting: Family Medicine

## 2013-09-01 DIAGNOSIS — R748 Abnormal levels of other serum enzymes: Secondary | ICD-10-CM

## 2013-09-02 ENCOUNTER — Telehealth: Payer: Self-pay | Admitting: Family Medicine

## 2013-09-02 NOTE — Telephone Encounter (Signed)
Called and let him know that ultrasound showed just fat in his liver.  He will have repeat LFTs in the next few days

## 2013-09-05 ENCOUNTER — Other Ambulatory Visit (INDEPENDENT_AMBULATORY_CARE_PROVIDER_SITE_OTHER): Payer: BC Managed Care – PPO | Admitting: Family Medicine

## 2013-09-05 DIAGNOSIS — R748 Abnormal levels of other serum enzymes: Secondary | ICD-10-CM

## 2013-09-05 DIAGNOSIS — D72819 Decreased white blood cell count, unspecified: Secondary | ICD-10-CM

## 2013-09-05 NOTE — Progress Notes (Signed)
Patient here today for labs only. °

## 2013-09-06 LAB — COMPREHENSIVE METABOLIC PANEL
ALBUMIN: 3.7 g/dL (ref 3.5–5.2)
ALT: 122 U/L — ABNORMAL HIGH (ref 0–53)
AST: 72 U/L — AB (ref 0–37)
Alkaline Phosphatase: 59 U/L (ref 39–117)
BUN: 12 mg/dL (ref 6–23)
CALCIUM: 9.4 mg/dL (ref 8.4–10.5)
CHLORIDE: 103 meq/L (ref 96–112)
CO2: 23 meq/L (ref 19–32)
CREATININE: 0.96 mg/dL (ref 0.50–1.35)
Glucose, Bld: 159 mg/dL — ABNORMAL HIGH (ref 70–99)
POTASSIUM: 4.1 meq/L (ref 3.5–5.3)
Sodium: 138 mEq/L (ref 135–145)
Total Bilirubin: 0.6 mg/dL (ref 0.3–1.2)
Total Protein: 6.9 g/dL (ref 6.0–8.3)

## 2013-09-06 LAB — CBC
HCT: 35.1 % — ABNORMAL LOW (ref 39.0–52.0)
Hemoglobin: 10.4 g/dL — ABNORMAL LOW (ref 13.0–17.0)
MCH: 17.5 pg — ABNORMAL LOW (ref 26.0–34.0)
MCHC: 29.6 g/dL — ABNORMAL LOW (ref 30.0–36.0)
MCV: 59.2 fL — ABNORMAL LOW (ref 78.0–100.0)
Platelets: 202 10*3/uL (ref 150–400)
RBC: 5.93 MIL/uL — ABNORMAL HIGH (ref 4.22–5.81)
RDW: 20.2 % — ABNORMAL HIGH (ref 11.5–15.5)
WBC: 5.2 10*3/uL (ref 4.0–10.5)

## 2013-09-07 ENCOUNTER — Telehealth: Payer: Self-pay | Admitting: Family Medicine

## 2013-09-07 NOTE — Telephone Encounter (Signed)
Called him- his labs continue to improve.  He will come and see me in about 2 weeks to follow liver enzymes and also follow-up on his DM.  He is feeling well

## 2013-09-19 ENCOUNTER — Ambulatory Visit (INDEPENDENT_AMBULATORY_CARE_PROVIDER_SITE_OTHER): Payer: BC Managed Care – PPO | Admitting: Internal Medicine

## 2013-09-19 VITALS — BP 124/78 | HR 127 | Temp 98.0°F | Resp 17 | Ht 74.5 in | Wt 307.0 lb

## 2013-09-19 DIAGNOSIS — E119 Type 2 diabetes mellitus without complications: Secondary | ICD-10-CM

## 2013-09-19 DIAGNOSIS — K7689 Other specified diseases of liver: Secondary | ICD-10-CM

## 2013-09-19 DIAGNOSIS — K760 Fatty (change of) liver, not elsewhere classified: Secondary | ICD-10-CM | POA: Insufficient documentation

## 2013-09-19 LAB — HEPATIC FUNCTION PANEL
ALT: 73 U/L — AB (ref 0–53)
AST: 42 U/L — ABNORMAL HIGH (ref 0–37)
Albumin: 4.2 g/dL (ref 3.5–5.2)
Alkaline Phosphatase: 56 U/L (ref 39–117)
BILIRUBIN INDIRECT: 0.6 mg/dL (ref 0.2–1.2)
Bilirubin, Direct: 0.1 mg/dL (ref 0.0–0.3)
Total Bilirubin: 0.7 mg/dL (ref 0.2–1.2)
Total Protein: 7.3 g/dL (ref 6.0–8.3)

## 2013-09-19 LAB — GLUCOSE, POCT (MANUAL RESULT ENTRY): POC GLUCOSE: 91 mg/dL (ref 70–99)

## 2013-09-19 NOTE — Progress Notes (Signed)
   Subjective:    Patient ID: Peter Powell, male    DOB: 10/25/1985, 28 y.o.   MRN: [id number]  HPI    Review of Systems     Objective:   Physical Exam        Assessment & Plan:

## 2013-09-19 NOTE — Patient Instructions (Addendum)
Fatty Liver Fatty liver is the accumulation of fat in liver cells. It is also called hepatosteatosis or steatohepatitis. It is normal for your liver to contain some fat. If fat is more than 5 to 10% of your liver's weight, you have fatty liver.  There are often no symptoms (problems) for years while damage is still occurring. People often learn about their fatty liver when they have medical tests for other reasons. Fat can damage your liver for years or even decades without causing problems. When it becomes severe, it can cause fatigue, weight loss, weakness, and confusion. This makes you more likely to develop more serious liver problems. The liver is the largest organ in the body. It does a lot of work and often gives no warning signs when it is sick until late in a disease. The liver has many important jobs including:  Breaking down foods.  Storing vitamins, iron, and other minerals.  Making proteins.  Making bile for food digestion.  Breaking down many products including medications, alcohol and some poisons. CAUSES  There are a number of different conditions, medications, and poisons that can cause a fatty liver. Eating too many calories causes fat to build up in the liver. Not processing and breaking fats down normally may also cause this. Certain conditions, such as obesity, diabetes, and high triglycerides also cause this. Most fatty liver patients tend to be middle-aged and over weight.  Some causes of fatty liver are:  Alcohol over consumption.  Malnutrition.  Steroid use.  Valproic acid toxicity.  Obesity.  Cushing's syndrome.  Poisons.  Tetracycline in high dosages.  Pregnancy.  Diabetes.  Hyperlipidemia.  Rapid weight loss. Some people develop fatty liver even having none of these conditions. SYMPTOMS  Fatty liver most often causes no problems. This is called asymptomatic.  It can be diagnosed with blood tests and also by a liver biopsy.  It is one of the  most common causes of minor elevations of liver enzymes on routine blood tests.  Specialized Imaging of the liver using ultrasound, CT (computed tomography) scan, or MRI (magnetic resonance imaging) can suggest a fatty liver but a biopsy is needed to confirm it.  A biopsy involves taking a small sample of liver tissue. This is done by using a needle. It is then looked at under a microscope by a specialist. TREATMENT  It is important to treat the cause. Simple fatty liver without a medical reason may not need treatment.  Weight loss, fat restriction, and exercise in overweight patients produces inconsistent results but is worth trying.  Fatty liver due to alcohol toxicity may not improve even with stopping drinking.  Good control of diabetes may reduce fatty liver.  Lower your triglycerides through diet, medication or both.  Eat a balanced, healthy diet.  Increase your physical activity.  Get regular checkups from a liver specialist.  There are no medical or surgical treatments for a fatty liver or NASH, but improving your diet and increasing your exercise may help prevent or reverse some of the damage. PROGNOSIS  Fatty liver may cause no damage or it can lead to an inflammation of the liver. This is, called steatohepatitis. When it is linked to alcohol abuse, it is called alcoholic steatohepatitis. It often is not linked to alcohol. It is then called nonalcoholic steatohepatitis, or NASH. Over time the liver may become scarred and hardened. This condition is called cirrhosis. Cirrhosis is serious and may lead to liver failure or cancer. NASH is one of the  leading causes of cirrhosis. About 10-20% of Americans have fatty liver and a smaller 2-5% has NASH. Document Released: 09/18/2005 Document Revised: 10/26/2011 Document Reviewed: 11/11/2005 [facility] Patient Information 2014 [place], [place]. Diabetes and Exercise Exercising regularly is important. It is not just about losing weight. It  has many health benefits, such as:  Improving your overall fitness, flexibility, and endurance.  Increasing your bone density.  Helping with weight control.  Decreasing your body fat.  Increasing your muscle strength.  Reducing stress and tension.  Improving your overall health. People with diabetes who exercise gain additional benefits because exercise:  Reduces appetite.  Improves the body's use of blood sugar (glucose).  Helps lower or control blood glucose.  Decreases blood pressure.  Helps control blood lipids (such as cholesterol and triglycerides).  Improves the body's use of the hormone insulin by:  Increasing the body's insulin sensitivity.  Reducing the body's insulin needs.  Decreases the risk for heart disease because exercising:  Lowers cholesterol and triglycerides levels.  Increases the levels of good cholesterol (such as high-density lipoproteins [HDL]) in the body.  Lowers blood glucose levels. YOUR ACTIVITY PLAN  Choose an activity that you enjoy and set realistic goals. Your health care provider or diabetes educator can help you make an activity plan that works for you. You can break activities into 2 or 3 sessions throughout the day. Doing so is as good as one long session. Exercise ideas include:  Taking the dog for a walk.  Taking the stairs instead of the elevator.  Dancing to your favorite song.  Doing your favorite exercise with a friend. RECOMMENDATIONS FOR EXERCISING WITH TYPE 1 OR TYPE 2 DIABETES   Check your blood glucose before exercising. If blood glucose levels are greater than 240 mg/dL, check for urine ketones. Do not exercise if ketones are present.  Avoid injecting insulin into areas of the body that are going to be exercised. For example, avoid injecting insulin into:  The arms when playing tennis.  The legs when jogging.  Keep a record of:  Food intake before and after you exercise.  Expected peak times of insulin  action.  Blood glucose levels before and after you exercise.  The type and amount of exercise you have done.  Review your records with your health care provider. Your health care provider will help you to develop guidelines for adjusting food intake and insulin amounts before and after exercising.  If you take insulin or oral hypoglycemic agents, watch for signs and symptoms of hypoglycemia. They include:  Dizziness.  Shaking.  Sweating.  Chills.  Confusion.  Drink plenty of water while you exercise to prevent dehydration or heat stroke. Body water is lost during exercise and must be replaced.  Talk to your health care provider before starting an exercise program to make sure it is safe for you. Remember, almost any type of activity is better than none. Document Released: 10/24/2003 Document Revised: 04/05/2013 Document Reviewed: 01/10/2013 [facility] Patient Information 2014 [place], [place]. Calorie Counting Diet A calorie counting diet requires you to eat the number of calories that are right for you in a day. Calories are the measurement of how much energy you get from the food you eat. Eating the right amount of calories is important for staying at a healthy weight. If you eat too many calories, your body will store them as fat and you may gain weight. If you eat too few calories, you may lose weight. Counting the number of calories you  eat during a day will help you know if you are eating the right amount. A Registered Dietitian can determine how many calories you need in a day. The amount of calories needed varies from person to person. If your goal is to lose weight, you will need to eat fewer calories. Losing weight can benefit you if you are overweight or have health problems such as heart disease, high blood pressure, or diabetes. If your goal is to gain weight, you will need to eat more calories. Gaining weight may be necessary if you have a certain health problem that causes your  body to need more energy. TIPS Whether you are increasing or decreasing the number of calories you eat during a day, it may be hard to get used to changes in what you eat and drink. The following are tips to help you keep track of the number of calories you eat.  Measure foods at home with measuring cups. This helps you know the amount of food and number of calories you are eating.  Restaurants often serve food in amounts that are larger than 1 serving. While eating out, estimate how many servings of a food you are given. For example, a serving of cooked rice is  cup or about the size of half of a fist. Knowing serving sizes will help you be aware of how much food you are eating at restaurants.  Ask for smaller portion sizes or child-size portions at restaurants.  Plan to eat half of a meal at a restaurant. Take the rest home or share the other half with a friend.  Read the Nutrition Facts panel on food labels for calorie content and serving size. You can find out how many servings are in a package, the size of a serving, and the number of calories each serving has.  For example, a package might contain 3 cookies. The Nutrition Facts panel on that package says that 1 serving is 1 cookie. Below that, it will say there are 3 servings in the container. The calories section of the Nutrition Facts label says there are 90 calories. This means there are 90 calories in 1 cookie (1 serving). If you eat 1 cookie you have eaten 90 calories. If you eat all 3 cookies, you have eaten 270 calories (3 servings x 90 calories = 270 calories). The list below tells you how big or small some common portion sizes are.  1 oz.........4 stacked dice.  3 oz........Marland Kitchen.Deck of cards.  1 tsp.......Marland Kitchen.Tip of little finger.  1 tbs......Marland Kitchen.Marland Kitchen.Thumb.  2 tbs.......Marland Kitchen.Golf ball.   cup......Marland Kitchen.Half of a fist.  1 cup.......Marland Kitchen.A fist. KEEP A FOOD LOG Write down every food item you eat, the amount you eat, and the number of calories in  each food you eat during the day. At the end of the day, you can add up the total number of calories you have eaten. It may help to keep a list like the one below. Find out the calorie information by reading the Nutrition Facts panel on food labels. Breakfast  Bran cereal (1 cup, 110 calories).  Fat-free milk ( cup, 45 calories). Snack  Apple (1 medium, 80 calories). Lunch  Spinach (1 cup, 20 calories).  Tomato ( medium, 20 calories).  Chicken breast strips (3 oz, 165 calories).  Shredded cheddar cheese ( cup, 110 calories).  Light [place] dressing (2 tbs, 60 calories).  Whole-wheat bread (1 slice, 80 calories).  Tub margarine (1 tsp, 35 calories).  Vegetable soup (1 cup, 160  calories). Dinner  Pork chop (3 oz, 190 calories).  Brown rice (1 cup, 215 calories).  Steamed broccoli ( cup, 20 calories).  Strawberries (1  cup, 65 calories).  Whipped cream (1 tbs, 50 calories). Daily Calorie Total: 1425 Document Released: 08/03/2005 Document Revised: 10/26/2011 Document Reviewed: 01/28/2007 [facility] Patient Information 2014 [place], [place]. Diabetes Meal Planning Guide The diabetes meal planning guide is a tool to help you plan your meals and snacks. It is important for people with diabetes to manage their blood glucose (sugar) levels. Choosing the right foods and the right amounts throughout your day will help control your blood glucose. Eating right can even help you improve your blood pressure and reach or maintain a healthy weight. CARBOHYDRATE COUNTING MADE EASY When you eat carbohydrates, they turn to sugar. This raises your blood glucose level. Counting carbohydrates can help you control this level so you feel better. When you plan your meals by counting carbohydrates, you can have more flexibility in what you eat and balance your medicine with your food intake. Carbohydrate counting simply means adding up the total amount of carbohydrate grams in your meals and  snacks. Try to eat about the same amount at each meal. Foods with carbohydrates are listed below. Each portion below is 1 carbohydrate serving or 15 grams of carbohydrates. Ask your dietician how many grams of carbohydrates you should eat at each meal or snack. Grains and Starches  1 slice bread.   English muffin or hotdog/hamburger bun.   cup cold cereal (unsweetened).   cup cooked pasta or rice.   cup starchy vegetables (corn, potatoes, peas, beans, winter squash).  1 tortilla (6 inches).   bagel.  1 waffle or pancake (size of a CD).   cup cooked cereal.  4 to 6 small crackers. *Whole grain is recommended. Fruit  1 cup fresh unsweetened berries, melon, papaya, pineapple.  1 small fresh fruit.   banana or mango.   cup fruit juice (4 oz unsweetened).   cup canned fruit in natural juice or water.  2 tbs dried fruit.  12 to 15 grapes or cherries. Milk and Yogurt  1 cup fat-free or 1% milk.  1 cup soy milk.  6 oz light yogurt with sugar-free sweetener.  6 oz low-fat soy yogurt.  6 oz plain yogurt. Vegetables  1 cup raw or  cup cooked is counted as 0 carbohydrates or a "free" food.  If you eat 3 or more servings at 1 meal, count them as 1 carbohydrate serving. Other Carbohydrates   oz chips or pretzels.   cup ice cream or frozen yogurt.   cup sherbet or sorbet.  2 inch square cake, no frosting.  1 tbs honey, sugar, jam, jelly, or syrup.  2 small cookies.  3 squares of graham crackers.  3 cups popcorn.  6 crackers.  1 cup broth-based soup.  Count 1 cup casserole or other mixed foods as 2 carbohydrate servings.  Foods with less than 20 calories in a serving may be counted as 0 carbohydrates or a "free" food. You may want to purchase a book or computer software that lists the carbohydrate gram counts of different foods. In addition, the nutrition facts panel on the labels of the foods you eat are a good source of this information.  The label will tell you how big the serving size is and the total number of carbohydrate grams you will be eating per serving. Divide this number by 15 to obtain the number of carbohydrate servings  in a portion. Remember, 1 carbohydrate serving equals 15 grams of carbohydrate. SERVING SIZES Measuring foods and serving sizes helps you make sure you are getting the right amount of food. The list below tells how big or small some common serving sizes are.  1 oz.........4 stacked dice.  3 oz........Marland KitchenDeck of cards.  1 tsp.......Marland KitchenTip of little finger.  1 tbs......Marland KitchenMarland KitchenThumb.  2 tbs.......Marland KitchenGolf ball.   cup......Marland KitchenHalf of a fist.  1 cup.......Marland KitchenA fist. SAMPLE DIABETES MEAL PLAN Below is a sample meal plan that includes foods from the grain and starches, dairy, vegetable, fruit, and meat groups. A dietician can individualize a meal plan to fit your calorie needs and tell you the number of servings needed from each food group. However, controlling the total amount of carbohydrates in your meal or snack is more important than making sure you include all of the food groups at every meal. You may interchange carbohydrate containing foods (dairy, starches, and fruits). The meal plan below is an example of a 2000 calorie diet using carbohydrate counting. This meal plan has 17 carbohydrate servings. Breakfast  1 cup oatmeal (2 carb servings).   cup light yogurt (1 carb serving).  1 cup blueberries (1 carb serving).   cup almonds. Snack  1 large apple (2 carb servings).  1 low-fat string cheese stick. Lunch  Chicken breast salad.  1 cup spinach.   cup chopped tomatoes.  2 oz chicken breast, sliced.  2 tbs low-fat [place] dressing.  12 whole-wheat crackers (2 carb servings).  12 to 15 grapes (1 carb serving).  1 cup low-fat milk (1 carb serving). Snack  1 cup carrots.   cup hummus (1 carb serving). Dinner  3 oz broiled salmon.  1 cup brown rice (3 carb servings). Snack  1   cups steamed broccoli (1 carb serving) drizzled with 1 tsp olive oil and lemon juice.  1 cup light pudding (2 carb servings). DIABETES MEAL PLANNING WORKSHEET Your dietician can use this worksheet to help you decide how many servings of foods and what types of foods are right for you.  BREAKFAST Food Group and Servings / Carb Servings Grain/Starches __________________________________ Dairy __________________________________________ Vegetable ______________________________________ Fruit ___________________________________________ Meat __________________________________________ Fat ____________________________________________ LUNCH Food Group and Servings / Carb Servings Grain/Starches ___________________________________ Dairy ___________________________________________ Fruit ____________________________________________ Meat ___________________________________________ Fat _____________________________________________ Laural Golden Food Group and Servings / Carb Servings Grain/Starches ___________________________________ Dairy ___________________________________________ Fruit ____________________________________________ Meat ___________________________________________ Fat _____________________________________________ SNACKS Food Group and Servings / Carb Servings Grain/Starches ___________________________________ Dairy ___________________________________________ Vegetable _______________________________________ Fruit ____________________________________________ Meat ___________________________________________ Fat _____________________________________________ DAILY TOTALS Starches _________________________ Vegetable ________________________ Fruit ____________________________ Dairy ____________________________ Meat ____________________________ Fat ______________________________ Document Released: 04/30/2005 Document Revised: 10/26/2011 Document Reviewed: 03/11/2009 [employer] Patient  Information 2014 [employer]

## 2013-09-19 NOTE — Progress Notes (Signed)
   Subjective:    Patient ID: Peter Powell, male    DOB: 07/13/1986, 28 y.o.   MRN: [id number]  HPI Patient presents in follow up with diabetes.  Dr. Patsy Lageropland called patient and was told to return to recheck his blood sugar  Is doing better, has lost 25lbs, home glucoses are 80-140, tolerates meds.  Elevated LFTs proven to be fatty liver on Abd. US.  Review of Systems     Objective:   Physical Exam  Vitals reviewed. Constitutional: He is oriented to person, place, and time. He appears well-developed and well-nourished. No distress.  HENT:  Head: Normocephalic.  Eyes: EOM are normal. No scleral icterus.  Neck: Normal range of motion.  Cardiovascular: Normal rate.   Pulmonary/Chest: Effort normal.  Musculoskeletal: Normal range of motion.  Neurological: He is alert and oriented to person, place, and time. He exhibits normal muscle tone. Coordination normal.  Psychiatric: He has a normal mood and affect.   Results for orders placed in visit on 09/19/13  GLUCOSE, POCT (MANUAL RESULT ENTRY)      Result Value Range   POC Glucose 91  70 - 99 mg/dl          Assessment & Plan:  Refer to Nutrition Continue same tx plan Lose weight/Exercise

## 2013-09-22 ENCOUNTER — Encounter: Payer: Self-pay | Admitting: Family Medicine

## 2013-11-07 ENCOUNTER — Ambulatory Visit: Payer: BC Managed Care – PPO

## 2013-11-14 ENCOUNTER — Ambulatory Visit: Payer: BC Managed Care – PPO

## 2013-11-21 ENCOUNTER — Ambulatory Visit: Payer: BC Managed Care – PPO

## 2014-01-11 ENCOUNTER — Other Ambulatory Visit: Payer: Self-pay | Admitting: Family Medicine

## 2014-02-21 ENCOUNTER — Telehealth: Payer: Self-pay | Admitting: Internal Medicine

## 2014-02-21 NOTE — Telephone Encounter (Signed)
LEFT MESSAGE FOR PATIENT TO RETURN CALL TO SCHEDULE NP APPT.  °

## 2014-02-22 ENCOUNTER — Telehealth: Payer: Self-pay | Admitting: Internal Medicine

## 2014-02-22 NOTE — Telephone Encounter (Signed)
CALLED PATIENT NOT ABLE TO LEAVE MESSAGE WILL CALL BACK.

## 2014-02-27 ENCOUNTER — Telehealth: Payer: Self-pay | Admitting: Internal Medicine

## 2014-02-27 NOTE — Telephone Encounter (Signed)
LEFT MESSAGE FOR PATIENT TO RETURN CALL TO SCHEDULE NP APPT.  °

## 2014-03-22 ENCOUNTER — Other Ambulatory Visit: Payer: Self-pay | Admitting: Family Medicine

## 2014-05-10 IMAGING — US US ABDOMEN LIMITED
1 series · 14 of 25 positions shown · non-contrast
Comparison: None.

CLINICAL DATA: Increased LFTs

EXAM:
US ABDOMEN LIMITED - RIGHT UPPER QUADRANT

[Series 1: us abdomen limited · 0.32mm/px · 14 of 49 slices shown]
[im 1/49]
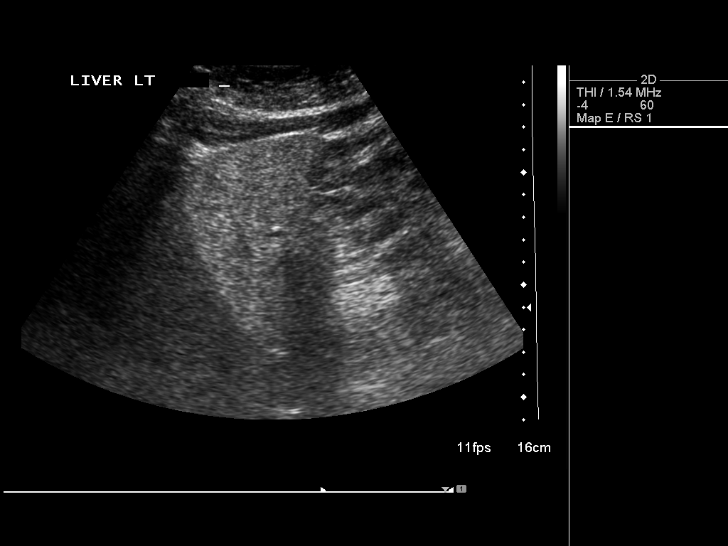
[im 5/49]
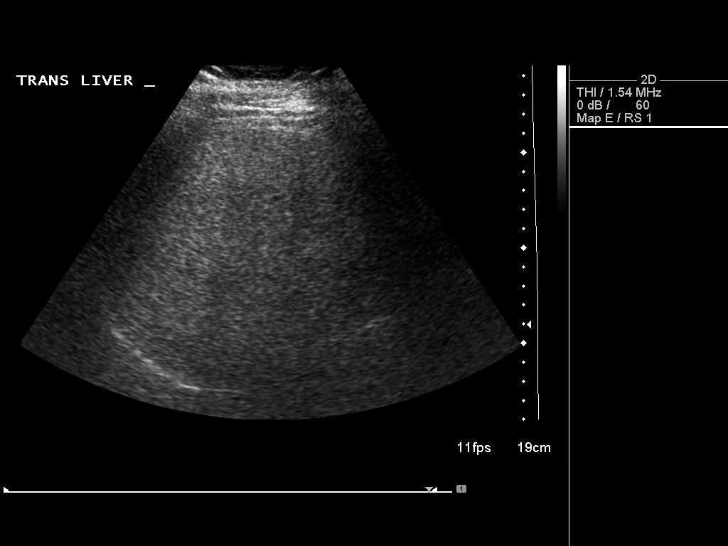
[im 9/49]
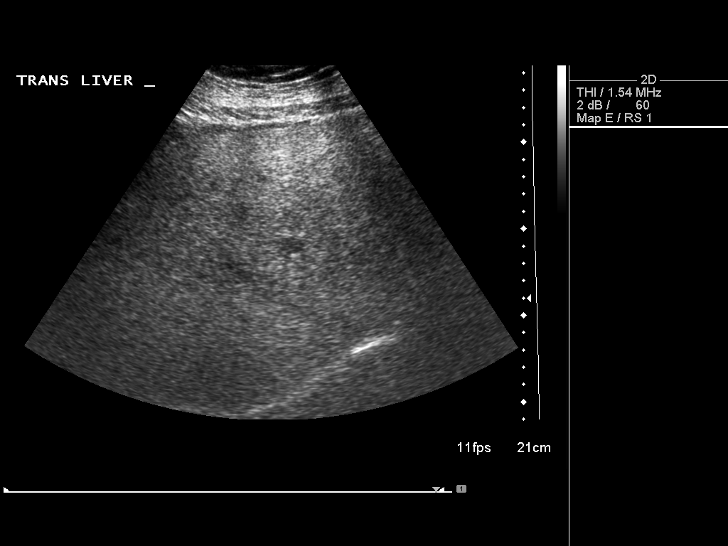
[im 13/49]
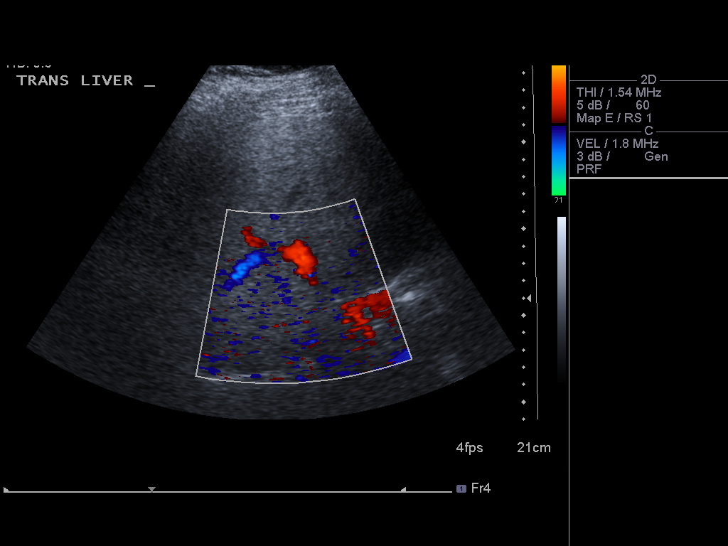
[im 17/49]
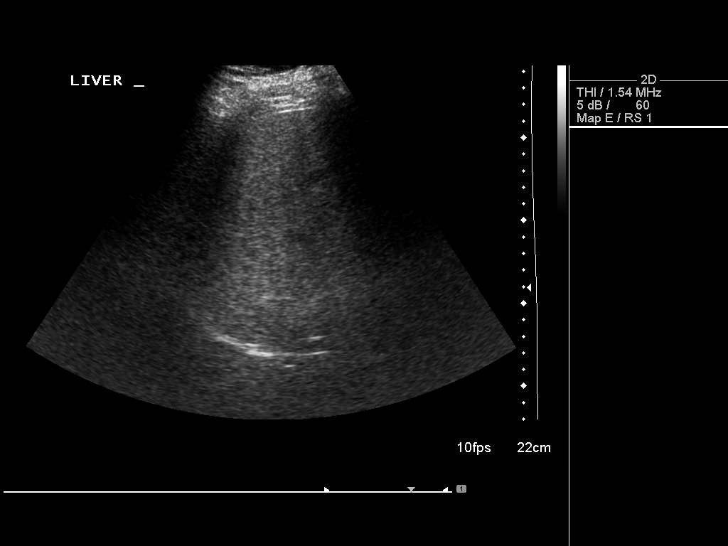
[im 19/49]
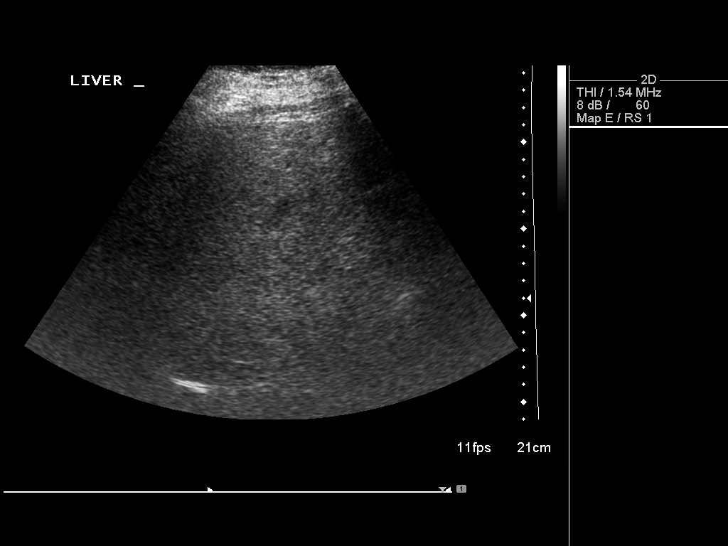
[im 23/49]
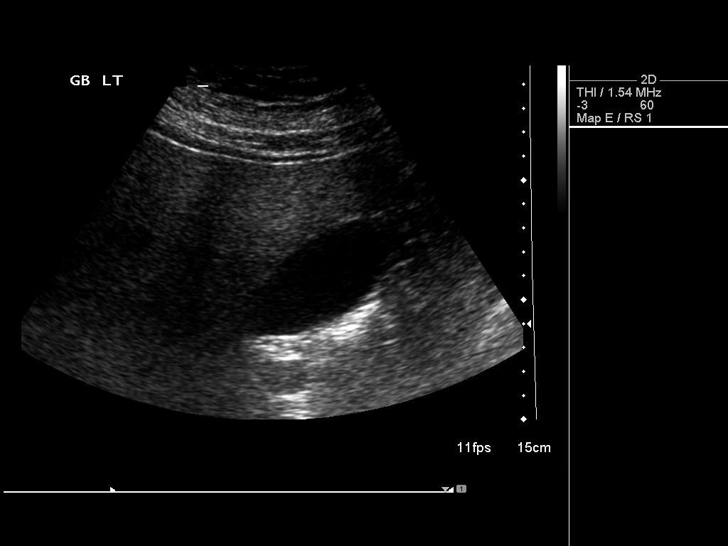
[im 27/49]
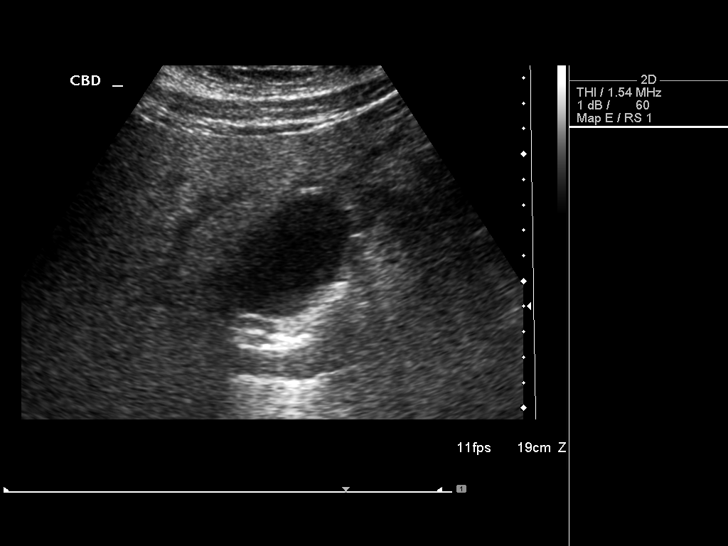
[im 31/49]
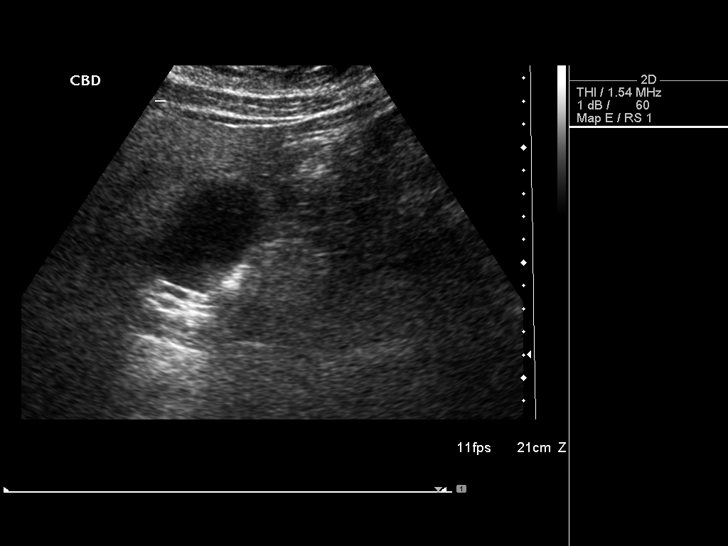
[im 33/49]
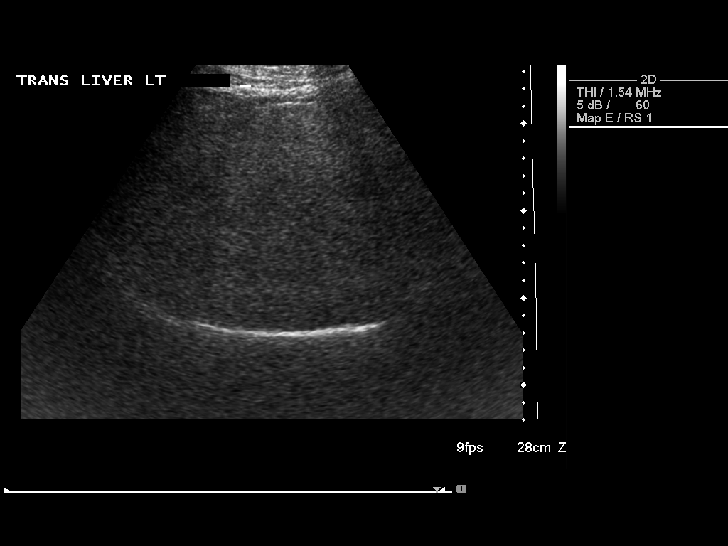
[im 37/49]
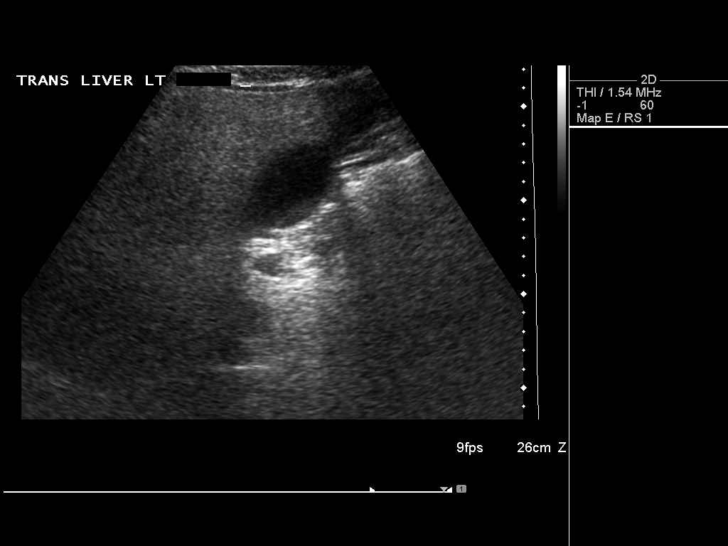
[im 41/49]
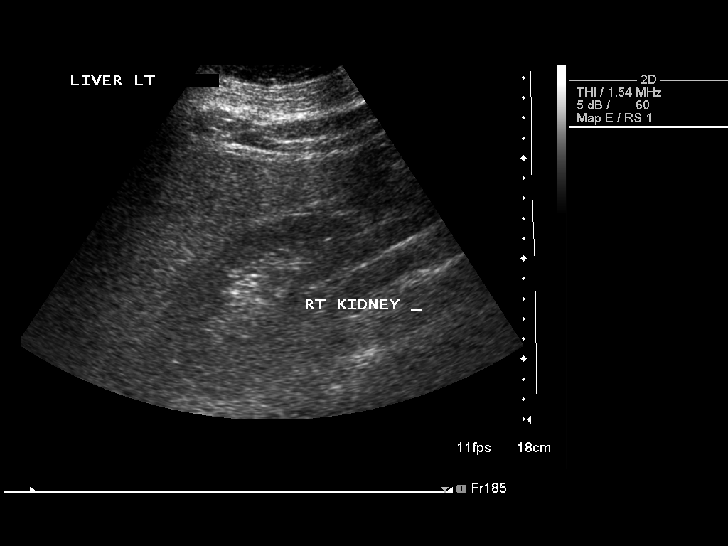
[im 45/49]
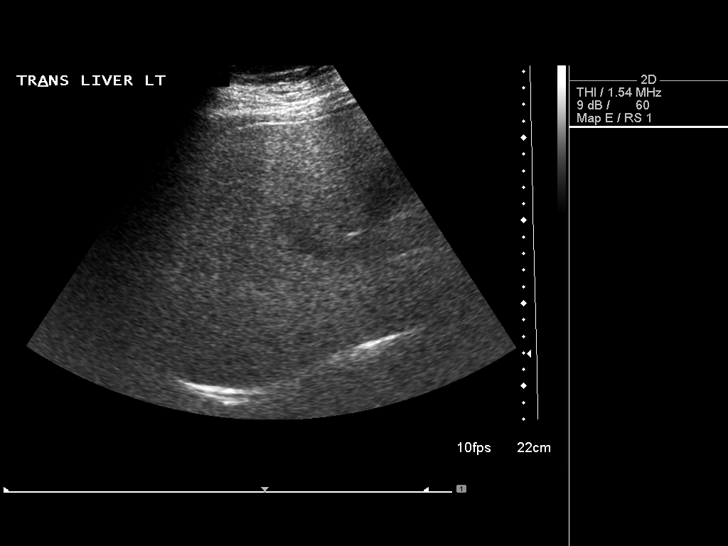
[im 49/49]
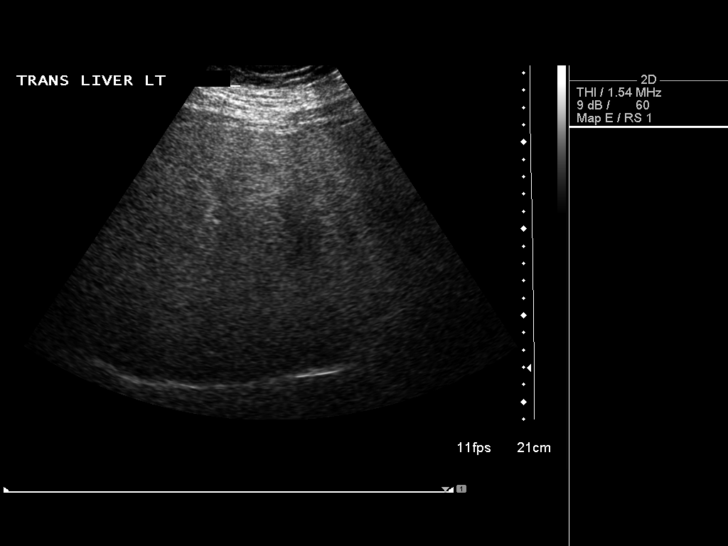

[14 of 25 positions shown; findings below may reference images not displayed]

FINDINGS: Gallbladder

No gallstones or wall thickening visualized. No sonographic Murphy
sign noted.

Common bile duct

Diameter: 4.5 mm

Liver:

No focal hepatic lesion. The liver is diffusely increased in
echogenicity as can be seen with hepatic steatosis.
IMPRESSION: 1. No cholelithiasis or sonographic evidence of acute cholecystitis.
2. Hepatic steatosis.

## 2015-09-06 ENCOUNTER — Encounter: Payer: Self-pay | Admitting: Family Medicine

## 2015-09-11 ENCOUNTER — Encounter: Payer: Self-pay | Admitting: Family Medicine

## 2018-05-05 ENCOUNTER — Other Ambulatory Visit: Payer: Self-pay

## 2018-05-05 ENCOUNTER — Encounter: Payer: Self-pay | Admitting: Physician Assistant

## 2018-05-05 ENCOUNTER — Ambulatory Visit (INDEPENDENT_AMBULATORY_CARE_PROVIDER_SITE_OTHER): Payer: BLUE CROSS/BLUE SHIELD | Admitting: Physician Assistant

## 2018-05-05 VITALS — BP 120/96 | HR 91 | Temp 98.7°F | Resp 16 | Ht 75.0 in | Wt 317.0 lb

## 2018-05-05 DIAGNOSIS — J029 Acute pharyngitis, unspecified: Secondary | ICD-10-CM

## 2018-05-05 DIAGNOSIS — R0981 Nasal congestion: Secondary | ICD-10-CM

## 2018-05-05 LAB — POCT RAPID STREP A (OFFICE): Rapid Strep A Screen: NEGATIVE

## 2018-05-05 MED ORDER — FLUTICASONE PROPIONATE 50 MCG/ACT NA SUSP
2.0000 | Freq: Every day | NASAL | 6 refills | Status: DC
Start: 2018-05-05 — End: 2023-01-18

## 2018-05-05 NOTE — Patient Instructions (Addendum)
Your rapid strep test was negative.  I am sending a culture to the lab to see if any bacteria grow.  If needed I will call you to start an antibiotic.  If you do not hear back from us you do not have strep throat. See below for home care tips. Come back if you are not improving in 5-7 days.   Start taking an antihistamine (Xyzal or Zyrtec) on a daily basis for the next 3 to 4 weeks Use Flonase nasal spray 1-2 times a day for the next 1 to 2 weeks.  Cepacol throat lozenges. Gargle with 8 oz of salt water ( tsp of salt per 1 qt of water) as often as every 1-2 hours to soothe your throat  ?Use a humidifier in your bedroom ?Use an over-the-counter cough medicine, or suck on cough drops or hard candy ?Stop smoking, if you smoke ?If you have allergies, avoid the things you are allergic to (like pollen, dust, animals, or mold) If you have acid reflux, your doctor or nurse will tell you which lifestyle changes can help reduce symptoms.   Stay well hydrated. Get lost of rest. Wash your hands often.   -Foods that can help speed recovery: honey, garlic, chicken soup, elderberries, green tea.  -Supplements that can help speed recovery: vitamin C, zinc, elderberry extract, quercetin, ginseng, selenium  Advil or ibuprofen for pain. Do not take Aspirin.  Drink enough water and fluids to keep your urine clear or pale yellow.  For sore throat try using a honey-based tea. Use 3 teaspoons of honey with juice squeezed from half lemon. Place shaved pieces of ginger into 1/2-1 cup of water and warm over stove top. Then mix the ingredients and repeat every 4 hours as needed.  Cough Syrup Recipe: Sweet Lemon & Honey Thyme  Ingredients . a handful of fresh thyme sprigs   . 1 pint of water (2 cups)  . 1/2 cup honey (raw is best, but regular will do)  . 1/2 lemon chopped Instructions 1. Place the lemon in the pint jar and cover with the honey. The honey will macerate the lemons and draw out liquids which  taste so delicious! 2. Meanwhile, toss the thyme leaves into a saucepan and cover them with the water. 3. Bring the water to a gentle simmer and reduce it to half, about a cup of tea. 4. When the tea is reduced and cooled a bit, strain the sprigs & leaves, add it into the pint jar and stir it well. 5. Give it a shake and use a spoonful as needed. 6. Store your homemade cough syrup in the refrigerator for about a month.

## 2018-05-05 NOTE — Progress Notes (Signed)
Peter Powell  MRN: [id number] DOB: 08/14/1986  PCP: Pearline Cablesopland, Jessica C, MD  Subjective:  Pt is a 32 year old male who presents to clinic for sore throat started yesterday.  Feels like something is there. He had a runny nose on Tuesday. Pain with swallowing.  Recently returned from a trip to [place]. Denies fever, chills, cough, runny, sneezing  Review of Systems  Constitutional: Negative for chills and fever.  HENT: Positive for congestion, postnasal drip, rhinorrhea and sore throat. Negative for trouble swallowing.   Cardiovascular: Negative for chest pain and palpitations.    Patient Active Problem List   Diagnosis Date Noted  . Fatty liver 09/19/2013  . Diabetes mellitus, type 2 (HCC) 08/19/2013  . Overweight 08/18/2013    Current Outpatient Medications on File Prior to Visit  Medication Sig Dispense Refill  . glucose blood (BAYER CONTOUR NEXT TEST) test strip Test daily as directed. Dx code: 45250.00 (Patient not taking: Reported on 05/05/2018) 100 each 1  . metFORMIN (GLUCOPHAGE) 500 MG tablet Take 500 mg by mouth 2 (two) times daily with a meal. Start with one pill before bed for the first week    . metFORMIN (GLUCOPHAGE) 500 MG tablet Take 1 tablet (500 mg total) by mouth 2 (two) times daily with a meal. (Patient not taking: Reported on 05/05/2018) 60 tablet 2   No current facility-administered medications on file prior to visit.     No Known Allergies   Objective:  BP (!) 120/96 (BP Location: Left Arm, Patient Position: Sitting, Cuff Size: Large)   Pulse 91   Temp 98.7 F (37.1 C) (Oral)   Resp 16   Ht 6\' 3"  (1.905 m)   Wt (!) 317 lb (143.8 kg)   SpO2 96%   BMI 39.62 kg/m   Physical Exam  Constitutional: He is oriented to person, place, and time. No distress.  HENT:  Right Ear: Tympanic membrane normal.  Left Ear: Tympanic membrane normal.  Nose: Mucosal edema present. No rhinorrhea. Right sinus exhibits no maxillary sinus tenderness and no frontal sinus  tenderness. Left sinus exhibits no maxillary sinus tenderness and no frontal sinus tenderness.  Mouth/Throat: Oropharynx is clear and moist and mucous membranes are normal.  Cardiovascular: Normal rate, regular rhythm and normal heart sounds.  Pulmonary/Chest: Effort normal and breath sounds normal. No respiratory distress. He has no wheezes. He has no rales.  Neurological: He is alert and oriented to person, place, and time.  Skin: Skin is warm and dry.  Psychiatric: Judgment normal.  Vitals reviewed.   Results for orders placed or performed in visit on 05/05/18  POCT rapid strep A  Result Value Ref Range   Rapid Strep A Screen Negative Negative    Assessment and Plan :  1. Sore throat -Patient presents complaining of sore throat and runny nose for the past several days.  Rapid strep is negative.  Cultures pending.  Vitals are stable and he does not appear ill.  Plan to treat supportively with Flonase and antihistamine.  Return to clinic if no improvement of symptoms. - POCT rapid strep A - Culture, Group A Strep  2. Nasal congestion - fluticasone (FLONASE) 50 MCG/ACT nasal spray; Place 2 sprays into both nostrils daily.  Dispense: 16 g; Refill: 6     Whitney Marcellas Marchant, PA-C  Primary Care at [facility] 05/05/2018 12:34 PM  Please note: Portions of this report may have been transcribed using dragon voice recognition software. Every effort was made to  ensure accuracy; however, inadvertent computerized transcription errors may be present.

## 2018-05-08 LAB — CULTURE, GROUP A STREP

## 2018-05-09 ENCOUNTER — Other Ambulatory Visit: Payer: Self-pay | Admitting: Physician Assistant

## 2018-05-09 DIAGNOSIS — J020 Streptococcal pharyngitis: Secondary | ICD-10-CM

## 2018-05-09 MED ORDER — PENICILLIN V POTASSIUM 500 MG PO TABS: 500.0000 mg | ORAL_TABLET | Freq: Three times a day (TID) | ORAL | 0 refills | Status: AC

## 2018-05-09 NOTE — Progress Notes (Signed)
Please call pt and ask if his throat is feeling better. If he is not feeling better, he can pick up an antibiotic at his pharmacy.  If he is better do not start the antibiotic. Thank you!

## 2019-06-15 ENCOUNTER — Encounter (HOSPITAL_COMMUNITY): Payer: Self-pay | Admitting: Emergency Medicine

## 2019-06-15 ENCOUNTER — Other Ambulatory Visit: Payer: Self-pay

## 2019-06-15 ENCOUNTER — Ambulatory Visit (HOSPITAL_COMMUNITY)
Admission: EM | Admit: 2019-06-15 | Discharge: 2019-06-15 | Disposition: A | Discharge status: Home / Self Care | Payer: BC Managed Care – PPO | Attending: Emergency Medicine | Admitting: Emergency Medicine

## 2019-06-15 DIAGNOSIS — B349 Viral infection, unspecified: Secondary | ICD-10-CM | POA: Diagnosis not present

## 2019-06-15 DIAGNOSIS — Z20822 Contact with and (suspected) exposure to covid-19: Secondary | ICD-10-CM

## 2019-06-15 DIAGNOSIS — R030 Elevated blood-pressure reading, without diagnosis of hypertension: Secondary | ICD-10-CM | POA: Diagnosis not present

## 2019-06-15 MED ORDER — ONDANSETRON HCL 4 MG PO TABS: 4.0000 mg | ORAL_TABLET | Freq: Four times a day (QID) | ORAL | 0 refills | Status: DC

## 2019-06-15 NOTE — Discharge Instructions (Addendum)
Take Tylenol for your body aches and chills.  Take the antinausea medication as prescribed.  Your COVID test is pending.  You should self quarantine until your test result is back and is negative.    Go to the emergency department if you develop high fever, shortness of breath, severe diarrhea, or other concerning symptoms.    Your blood pressure is elevated today at 145/102.  Please have this rechecked by your primary care provider in 2-4 weeks.

## 2019-06-15 NOTE — ED Provider Notes (Signed)
[facility]    CSN: [id number] Arrival date & time: 06/15/19  1120      History   Chief Complaint Chief Complaint  Patient presents with  . Cough  . Fever    HPI Peter Powell is a 33 y.o. male.   Patient presents with 2-day history of fever, nonproductive cough, body aches, vomiting, diarrhea.  He reports his last episode of vomiting or diarrhea was yesterday.  He did not take his temperature at home but felt warm.  He denies rash, sore throat, shortness of breath, or other symptoms.  He had a COVID test done this morning at the drive through testing site.  No treatments attempted at home.  The history is provided by the patient.    Past Medical History:  Diagnosis Date  . Diabetes mellitus without complication [facility])     Patient Active Problem List   Diagnosis Date Noted  . Fatty liver 09/19/2013  . Diabetes mellitus, type 2 (HCC) 08/19/2013  . Overweight 08/18/2013    Past Surgical History:  Procedure Laterality Date  . KNEE SURGERY Right 2001   screws placed below kneecap       Home Medications    Prior to Admission medications   Medication Sig Start Date End Date Taking? Authorizing Provider  fluticasone (FLONASE) 50 MCG/ACT nasal spray Place 2 sprays into both nostrils daily. 05/05/18   McVey, Madelaine Bhat, PA-C  glucose blood (BAYER CONTOUR NEXT TEST) test strip Test daily as directed. Dx code: 57.00 Patient not taking: Reported on 05/05/2018 03/22/14   Jonita Albee, MD  metFORMIN (GLUCOPHAGE) 500 MG tablet Take 500 mg by mouth 2 (two) times daily with a meal. Start with one pill before bed for the first week 08/19/13   Copland, Gwenlyn Found, MD  metFORMIN (GLUCOPHAGE) 500 MG tablet Take 1 tablet (500 mg total) by mouth 2 (two) times daily with a meal. Patient not taking: Reported on 05/05/2018    Jonita Albee, MD  ondansetron (ZOFRAN) 4 MG tablet Take 1 tablet (4 mg total) by mouth every 6 (six) hours. 06/15/19   Mickie Bail, NP    Family  History No family history on file.  Social History Social History   Tobacco Use  . Smoking status: Never Smoker  . Smokeless tobacco: Never Used  Substance Use Topics  . Alcohol use: Yes    Alcohol/week: 6.0 standard drinks    Types: 6 Standard drinks or equivalent per week    Comment: on the weekend  . Drug use: Yes    Types: Marijuana     Allergies   Patient has no known allergies.   Review of Systems Review of Systems  Constitutional: Positive for fever. Negative for chills.  HENT: Negative for congestion, ear pain, rhinorrhea and sore throat.   Eyes: Negative for pain and visual disturbance.  Respiratory: Positive for cough. Negative for shortness of breath.   Cardiovascular: Negative for chest pain and palpitations.  Gastrointestinal: Positive for diarrhea and vomiting. Negative for abdominal pain.  Genitourinary: Negative for dysuria and hematuria.  Musculoskeletal: Negative for arthralgias and back pain.  Skin: Negative for color change and rash.  Neurological: Negative for seizures and syncope.  All other systems reviewed and are negative.    Physical Exam Triage Vital Signs ED Triage Vitals  Enc Vitals Group     BP      Pulse      Resp      Temp  Temp src      SpO2      Weight      Height      Head Circumference      Peak Flow      Pain Score      Pain Loc      Pain Edu?      Excl. in [place]?    No data found.  Updated Vital Signs BP (!) 145/102   Pulse (!) 108   Temp 98.5 F (36.9 C) (Oral)   Resp 16   SpO2 98%   Visual Acuity Right Eye Distance:   Left Eye Distance:   Bilateral Distance:    Right Eye Near:   Left Eye Near:    Bilateral Near:     Physical Exam Vitals signs and nursing note reviewed.  Constitutional:      Appearance: He is well-developed.  HENT:     Head: Normocephalic and atraumatic.     Right Ear: Tympanic membrane normal.     Left Ear: Tympanic membrane normal.     Nose: Nose normal.     Mouth/Throat:      Mouth: Mucous membranes are moist.     Pharynx: Oropharynx is clear.  Eyes:     Conjunctiva/sclera: Conjunctivae normal.  Neck:     Musculoskeletal: Neck supple.  Cardiovascular:     Rate and Rhythm: Normal rate and regular rhythm.     Heart sounds: No murmur.  Pulmonary:     Effort: Pulmonary effort is normal. No respiratory distress.     Breath sounds: Normal breath sounds.  Abdominal:     General: Bowel sounds are normal.     Palpations: Abdomen is soft.     Tenderness: There is no abdominal tenderness. There is no guarding or rebound.  Skin:    General: Skin is warm and dry.     Findings: No rash.  Neurological:     General: No focal deficit present.     Mental Status: He is alert and oriented to person, place, and time.      UC Treatments / Results  Labs (all labs ordered are listed, but only abnormal results are displayed) Labs Reviewed - No data to display  EKG   Radiology No results found.  Procedures Procedures (including critical care time)  Medications Ordered in UC Medications - No data to display  Initial Impression / Assessment and Plan / UC Course  I have reviewed the triage vital signs and the nursing notes.  Pertinent labs & imaging results that were available during my care of the patient were reviewed by me and considered in my medical decision making (see chart for details).    Viral illness.  Elevated blood pressure reading.  Instructed patient to take Tylenol as needed.  Treating also with Zofran.  COVID test performed here.  Instructed patient to self quarantine until the test result is back.  Instructed patient to go to the emergency department if he develops high fever, shortness of breath, severe diarrhea, or other concerning symptoms.  Discussed with patient that his blood pressure is elevated today and needs to be rechecked by his PCP in 2 to 4 weeks.  Patient agrees with plan of care.   Final Clinical Impressions(s) / UC Diagnoses    Final diagnoses:  Viral illness  Elevated blood pressure reading     Discharge Instructions     Take Tylenol for your body aches and chills.  Take the antinausea medication as prescribed.  Your COVID test is pending.  You should self quarantine until your test result is back and is negative.    Go to the emergency department if you develop high fever, shortness of breath, severe diarrhea, or other concerning symptoms.    Your blood pressure is elevated today at 145/102.  Please have this rechecked by your primary care provider in 2-4 weeks.      ED Prescriptions    Medication Sig Dispense Auth. Provider   ondansetron (ZOFRAN) 4 MG tablet Take 1 tablet (4 mg total) by mouth every 6 (six) hours. 12 tablet Mickie Bailate, [place] Inthavong H, NP     PDMP not reviewed this encounter.   Mickie Bailate, Danilynn Jemison H, NP 06/15/19 [phone]

## 2019-06-15 NOTE — ED Triage Notes (Signed)
PT was COVID tested this morning. Here for fever, chills, cough, bodyaches. Would like to rule out other causes and get medication for symptoms.

## 2019-06-16 LAB — NOVEL CORONAVIRUS, NAA: SARS-CoV-2, NAA: DETECTED — AB

## 2023-01-18 ENCOUNTER — Ambulatory Visit: Payer: 59 | Admitting: Internal Medicine

## 2023-01-18 ENCOUNTER — Encounter: Payer: Self-pay | Admitting: Internal Medicine

## 2023-01-18 VITALS — BP 146/100 | HR 114 | Temp 98.4°F | Ht 75.0 in | Wt 298.8 lb

## 2023-01-18 DIAGNOSIS — I10 Essential (primary) hypertension: Secondary | ICD-10-CM

## 2023-01-18 DIAGNOSIS — Z114 Encounter for screening for human immunodeficiency virus [HIV]: Secondary | ICD-10-CM

## 2023-01-18 DIAGNOSIS — E1169 Type 2 diabetes mellitus with other specified complication: Secondary | ICD-10-CM

## 2023-01-18 DIAGNOSIS — Z7985 Long-term (current) use of injectable non-insulin antidiabetic drugs: Secondary | ICD-10-CM

## 2023-01-18 DIAGNOSIS — Z6837 Body mass index (BMI) 37.0-37.9, adult: Secondary | ICD-10-CM

## 2023-01-18 LAB — POCT GLYCOSYLATED HEMOGLOBIN (HGB A1C): Hemoglobin A1C: 13.9 % — AB (ref 4.0–5.6)

## 2023-01-18 MED ORDER — VALSARTAN-HYDROCHLOROTHIAZIDE 160-25 MG PO TABS
1.0000 | ORAL_TABLET | Freq: Every day | ORAL | 1 refills | Status: DC
Start: 2023-01-18 — End: 2023-08-02

## 2023-01-18 MED ORDER — OZEMPIC (0.25 OR 0.5 MG/DOSE) 2 MG/3ML ~~LOC~~ SOPN
0.5000 mg | PEN_INJECTOR | SUBCUTANEOUS | 2 refills | Status: DC
Start: 2023-01-18 — End: 2023-04-20

## 2023-01-18 NOTE — Assessment & Plan Note (Signed)
-  Discussed healthy lifestyle, including increased physical activity and better food choices to promote weight loss. -Ozempic should help some with weight loss as well.

## 2023-01-18 NOTE — Assessment & Plan Note (Signed)
Very uncontrolled with an A1c of 13.9 today. Metformin alone will not get him to goal. Start ozempic. Discussed importance of lifestyle changes at great length. Will return in 3 months for follow up.

## 2023-01-18 NOTE — Progress Notes (Signed)
New Patient Office Visit     CC/Reason for Visit: Establish care, discuss chronic conditions Previous PCP: None Last Visit: Unknown  HPI: Saylor Banfill is a 37 y.o. male who is coming in today for the above mentioned reasons. Past Medical History is significant for: Morbid obesity and type 2 diabetes.  He has not been on medication for at least 4 to 5 years.  He used to be on metformin.  He has never been told he has high blood pressure or hyperlipidemia.  He works as a Engineer, maintenance, he does not smoke, he drinks alcohol occasionally, no known drug allergies.  He has had a knee surgery in the past, no family history of significance.   Past Medical/Surgical History: Past Medical History:  Diagnosis Date   DM (diabetes mellitus), type 2 (HCC)    Hypertension    Morbid obesity (HCC)     Past Surgical History:  Procedure Laterality Date   KNEE SURGERY Right 2001   screws placed below kneecap    Social History:  reports that he has never smoked. He has never used smokeless tobacco. He reports current alcohol use of about 6.0 standard drinks of alcohol per week. He reports current drug use. Drug: Marijuana.  Allergies: No Known Allergies  Family History:  History reviewed. No pertinent family history.   Current Outpatient Medications:    Semaglutide,0.25 or 0.5MG /DOS, (OZEMPIC, 0.25 OR 0.5 MG/DOSE,) 2 MG/3ML SOPN, Inject 0.5 mg into the skin once a week., Disp: 3 mL, Rfl: 2   valsartan-hydrochlorothiazide (DIOVAN-HCT) 160-25 MG tablet, Take 1 tablet by mouth daily., Disp: 90 tablet, Rfl: 1  Review of Systems:  Negative except as indicated in HPI.   Physical Exam: Vitals:   01/18/23 1535 01/18/23 1540  BP: (!) 140/98 (!) 146/100  Pulse: (!) 114   Temp: 98.4 F (36.9 C)   TempSrc: Oral   SpO2: 98%   Weight: 298 lb 12.8 oz (135.5 kg)   Height: 6\' 3"  (1.905 m)    Body mass index is 37.35 kg/m.  Physical Exam Vitals reviewed.  Constitutional:       Appearance: Normal appearance.  HENT:     Head: Normocephalic and atraumatic.  Eyes:     Conjunctiva/sclera: Conjunctivae normal.     Pupils: Pupils are equal, round, and reactive to light.  Cardiovascular:     Rate and Rhythm: Normal rate and regular rhythm.  Pulmonary:     Effort: Pulmonary effort is normal.     Breath sounds: Normal breath sounds.  Skin:    General: Skin is warm and dry.  Neurological:     General: No focal deficit present.     Mental Status: He is alert and oriented to person, place, and time.  Psychiatric:        Mood and Affect: Mood normal.        Behavior: Behavior normal.        Thought Content: Thought content normal.        Judgment: Judgment normal.       Impression and Plan:  Type 2 diabetes mellitus with other specified complication, without long-term current use of insulin (HCC) Assessment & Plan: Very uncontrolled with an A1c of 13.9 today. Metformin alone will not get him to goal. Start ozempic. Discussed importance of lifestyle changes at great length. Will return in 3 months for follow up.  Orders: -     POCT glycosylated hemoglobin (Hb A1C) -  Microalbumin / creatinine urine ratio; Future -     CBC with Differential/Platelet; Future -     Comprehensive metabolic panel; Future -     Hemoglobin A1c; Future -     Lipid panel; Future -     HIV Antibody (routine testing w rflx); Future -     Ozempic (0.25 or 0.5 MG/DOSE); Inject 0.5 mg into the skin once a week.  Dispense: 3 mL; Refill: 2  Primary hypertension Assessment & Plan: New diagnosis. Start diovan HCT. Return in 8 weeks for follow up. He will do ambulatory BP measurements.  Orders: -     Valsartan-hydroCHLOROthiazide; Take 1 tablet by mouth daily.  Dispense: 90 tablet; Refill: 1  Morbid obesity (HCC) Assessment & Plan: -Discussed healthy lifestyle, including increased physical activity and better food choices to promote weight loss. -Ozempic should help some with weight  loss as well.   Encounter for screening for HIV    Time spent 46 minutes reviewing chart, interviewing and examining patient, formulating plan of care.  Discussed lifestyle changes at length.     Chaya Jan, MD [facility]

## 2023-01-18 NOTE — Assessment & Plan Note (Signed)
New diagnosis. Start diovan HCT. Return in 8 weeks for follow up. He will do ambulatory BP measurements.

## 2023-01-19 ENCOUNTER — Other Ambulatory Visit (INDEPENDENT_AMBULATORY_CARE_PROVIDER_SITE_OTHER): Payer: 59

## 2023-01-19 ENCOUNTER — Other Ambulatory Visit: Payer: 59

## 2023-01-19 ENCOUNTER — Encounter: Payer: Self-pay | Admitting: Internal Medicine

## 2023-01-19 DIAGNOSIS — E1169 Type 2 diabetes mellitus with other specified complication: Secondary | ICD-10-CM | POA: Diagnosis not present

## 2023-01-19 LAB — CBC WITH DIFFERENTIAL/PLATELET
Basophils Absolute: 0 10*3/uL (ref 0.0–0.1)
Basophils Relative: 0.7 % (ref 0.0–3.0)
Eosinophils Absolute: 0.3 10*3/uL (ref 0.0–0.7)
Eosinophils Relative: 4.2 % (ref 0.0–5.0)
HCT: 49.1 % (ref 39.0–52.0)
Hemoglobin: 15.7 g/dL (ref 13.0–17.0)
Lymphocytes Relative: 30.5 % (ref 12.0–46.0)
Lymphs Abs: 2.1 10*3/uL (ref 0.7–4.0)
MCHC: 31.9 g/dL (ref 30.0–36.0)
MCV: 70.9 fl — ABNORMAL LOW (ref 78.0–100.0)
Monocytes Absolute: 0.4 10*3/uL (ref 0.1–1.0)
Monocytes Relative: 6 % (ref 3.0–12.0)
Neutro Abs: 4 10*3/uL (ref 1.4–7.7)
Neutrophils Relative %: 58.6 % (ref 43.0–77.0)
Platelets: 188 10*3/uL (ref 150.0–400.0)
RBC: 6.93 Mil/uL — ABNORMAL HIGH (ref 4.22–5.81)
RDW: 13.9 % (ref 11.5–15.5)
WBC: 6.8 10*3/uL (ref 4.0–10.5)

## 2023-01-19 LAB — MICROALBUMIN / CREATININE URINE RATIO
Creatinine,U: 67.7 mg/dL
Microalb Creat Ratio: 2.3 mg/g (ref 0.0–30.0)
Microalb, Ur: 1.6 mg/dL (ref 0.0–1.9)

## 2023-01-19 LAB — COMPREHENSIVE METABOLIC PANEL
ALT: 34 U/L (ref 0–53)
AST: 21 U/L (ref 0–37)
Albumin: 4.4 g/dL (ref 3.5–5.2)
Alkaline Phosphatase: 64 U/L (ref 39–117)
BUN: 17 mg/dL (ref 6–23)
CO2: 30 mEq/L (ref 19–32)
Calcium: 9.5 mg/dL (ref 8.4–10.5)
Chloride: 97 mEq/L (ref 96–112)
Creatinine, Ser: 0.9 mg/dL (ref 0.40–1.50)
GFR: 108.8 mL/min (ref >60.00)
Glucose, Bld: 298 mg/dL — ABNORMAL HIGH (ref 70–99)
Potassium: 4.1 mEq/L (ref 3.5–5.1)
Sodium: 135 mEq/L (ref 135–145)
Total Bilirubin: 0.7 mg/dL (ref 0.2–1.2)
Total Protein: 7.9 g/dL (ref 6.0–8.3)

## 2023-01-19 LAB — LIPID PANEL
Cholesterol: 304 mg/dL — ABNORMAL HIGH (ref 0–200)
HDL: 36 mg/dL — ABNORMAL LOW (ref >39.00)
LDL Cholesterol: 234 mg/dL — ABNORMAL HIGH (ref 0–99)
NonHDL: 267.61
Total CHOL/HDL Ratio: 8
Triglycerides: 166 mg/dL — ABNORMAL HIGH (ref 0.0–149.0)
VLDL: 33.2 mg/dL (ref 0.0–40.0)

## 2023-01-19 NOTE — Telephone Encounter (Signed)
PA started [id number]

## 2023-01-20 ENCOUNTER — Encounter: Payer: Self-pay | Admitting: Internal Medicine

## 2023-01-20 LAB — HEMOGLOBIN A1C
Hgb A1c MFr Bld: 12.7 % of total Hgb — ABNORMAL HIGH (ref <5.7)
Mean Plasma Glucose: 318 mg/dL
eAG (mmol/L): 17.6 mmol/L

## 2023-01-20 LAB — HIV ANTIBODY (ROUTINE TESTING W REFLEX): HIV 1&2 Ab, 4th Generation: NONREACTIVE

## 2023-01-20 NOTE — Telephone Encounter (Signed)
Your information has been sent to [facility]. 

## 2023-01-21 ENCOUNTER — Other Ambulatory Visit: Payer: Self-pay | Admitting: Internal Medicine

## 2023-01-21 ENCOUNTER — Encounter: Payer: Self-pay | Admitting: Internal Medicine

## 2023-01-21 DIAGNOSIS — E1169 Type 2 diabetes mellitus with other specified complication: Secondary | ICD-10-CM | POA: Insufficient documentation

## 2023-01-21 MED ORDER — ATORVASTATIN CALCIUM 40 MG PO TABS
40.0000 mg | ORAL_TABLET | Freq: Every day | ORAL | 1 refills | Status: DC
Start: 2023-01-21 — End: 2023-04-22

## 2023-01-25 MED ORDER — ATORVASTATIN CALCIUM 40 MG PO TABS: 40.0000 mg | ORAL_TABLET | Freq: Every day | ORAL | 3 refills | Status: DC

## 2023-04-01 ENCOUNTER — Encounter (INDEPENDENT_AMBULATORY_CARE_PROVIDER_SITE_OTHER): Payer: Self-pay

## 2023-04-20 ENCOUNTER — Encounter: Payer: Self-pay | Admitting: Internal Medicine

## 2023-04-20 ENCOUNTER — Ambulatory Visit: Payer: 59 | Admitting: Internal Medicine

## 2023-04-20 VITALS — BP 150/92 | HR 103 | Temp 97.7°F | Wt 294.5 lb

## 2023-04-20 DIAGNOSIS — Z6836 Body mass index (BMI) 36.0-36.9, adult: Secondary | ICD-10-CM

## 2023-04-20 DIAGNOSIS — E785 Hyperlipidemia, unspecified: Secondary | ICD-10-CM | POA: Diagnosis not present

## 2023-04-20 DIAGNOSIS — Z23 Encounter for immunization: Secondary | ICD-10-CM | POA: Diagnosis not present

## 2023-04-20 DIAGNOSIS — I10 Essential (primary) hypertension: Secondary | ICD-10-CM

## 2023-04-20 DIAGNOSIS — Z7985 Long-term (current) use of injectable non-insulin antidiabetic drugs: Secondary | ICD-10-CM

## 2023-04-20 DIAGNOSIS — E1169 Type 2 diabetes mellitus with other specified complication: Secondary | ICD-10-CM

## 2023-04-20 LAB — LIPID PANEL
Cholesterol: 202 mg/dL — ABNORMAL HIGH (ref 0–200)
HDL: 32.9 mg/dL — ABNORMAL LOW (ref >39.00)
LDL Cholesterol: 140 mg/dL — ABNORMAL HIGH (ref 0–99)
NonHDL: 169.57
Total CHOL/HDL Ratio: 6
Triglycerides: 148 mg/dL (ref 0.0–149.0)
VLDL: 29.6 mg/dL (ref 0.0–40.0)

## 2023-04-20 LAB — POCT GLYCOSYLATED HEMOGLOBIN (HGB A1C): Hemoglobin A1C: 11.8 % — AB (ref 4.0–5.6)

## 2023-04-20 MED ORDER — SEMAGLUTIDE (1 MG/DOSE) 4 MG/3ML ~~LOC~~ SOPN
1.0000 mg | PEN_INJECTOR | SUBCUTANEOUS | 2 refills | Status: AC
Start: 2023-04-20 — End: ?

## 2023-04-20 MED ORDER — AMLODIPINE BESYLATE 5 MG PO TABS
5.0000 mg | ORAL_TABLET | Freq: Every day | ORAL | 1 refills | Status: DC
Start: 2023-04-20 — End: 2023-11-10

## 2023-04-20 NOTE — Assessment & Plan Note (Signed)
Not well-controlled.  Continue Diovan HCT 160/25 mg, add amlodipine 5 mg daily and return in 3 months for follow-up.

## 2023-04-20 NOTE — Assessment & Plan Note (Signed)
Has been on atorvastatin 40 mg for 3 months, recheck lipids today.

## 2023-04-20 NOTE — Addendum Note (Signed)
Addended by: Kern Reap B on: 04/20/2023 02:53 PM   Modules accepted: Orders

## 2023-04-20 NOTE — Assessment & Plan Note (Signed)
Discussed healthy lifestyle, including increased physical activity and better food choices to promote weight loss.  

## 2023-04-20 NOTE — Addendum Note (Signed)
Addended by: Chaya Jan Y on: 04/20/2023 10:41 AM   Modules accepted: Orders

## 2023-04-20 NOTE — Assessment & Plan Note (Signed)
Uncontrolled although improved with an A1c of 11.8 down from 12.7.  Increase Ozempic to 1 mg return in 3 months for follow-up.  Continue to work on lifestyle changes.

## 2023-04-20 NOTE — Progress Notes (Addendum)
Established Patient Office Visit     CC/Reason for Visit: 41-month follow-up chronic conditions  HPI: Peter Powell is a 37 y.o. male who is coming in today for the above mentioned reasons. Past Medical History is significant for: Hypertension, hyperlipidemia, type 2 diabetes, morbid obesity.  He is here for his second visit.  At last visit we started him on Ozempic, atorvastatin and Diovan HCT.  He has been compliant with medication.  He has lost about 5 to 6 pounds.  He has not been checking ambulatory blood pressure.   Past Medical/Surgical History: Past Medical History:  Diagnosis Date   DM (diabetes mellitus), type 2 (HCC)    Hypertension    Morbid obesity (HCC)     Past Surgical History:  Procedure Laterality Date   KNEE SURGERY Right 2001   screws placed below kneecap    Social History:  reports that he has never smoked. He has never used smokeless tobacco. He reports current alcohol use of about 6.0 standard drinks of alcohol per week. He reports current drug use. Drug: Marijuana.  Allergies: No Known Allergies  Family History:  No history of heart disease, cancer, stroke that he is aware of   Current Outpatient Medications:    amLODipine (NORVASC) 5 MG tablet, Take 1 tablet (5 mg total) by mouth daily., Disp: 90 tablet, Rfl: 1   atorvastatin (LIPITOR) 40 MG tablet, Take 1 tablet (40 mg total) by mouth daily., Disp: 90 tablet, Rfl: 1   Semaglutide, 1 MG/DOSE, 4 MG/3ML SOPN, Inject 1 mg as directed once a week., Disp: 3 mL, Rfl: 2   valsartan-hydrochlorothiazide (DIOVAN-HCT) 160-25 MG tablet, Take 1 tablet by mouth daily., Disp: 90 tablet, Rfl: 1  Review of Systems:  Negative unless indicated in HPI.   Physical Exam: Vitals:   04/20/23 0937 04/20/23 0945  BP: (!) 150/98 (!) 150/92  Pulse: (!) 103   Temp: 97.7 F (36.5 C)   TempSrc: Oral   SpO2: 99%   Weight: 294 lb 8 oz (133.6 kg)     Body mass index is 36.81 kg/m.   Physical Exam Vitals  reviewed.  Constitutional:      Appearance: Normal appearance.  HENT:     Head: Normocephalic and atraumatic.  Eyes:     Conjunctiva/sclera: Conjunctivae normal.     Pupils: Pupils are equal, round, and reactive to light.  Cardiovascular:     Rate and Rhythm: Normal rate and regular rhythm.  Pulmonary:     Effort: Pulmonary effort is normal.     Breath sounds: Normal breath sounds.  Skin:    General: Skin is warm and dry.  Neurological:     General: No focal deficit present.     Mental Status: He is alert and oriented to person, place, and time.  Psychiatric:        Mood and Affect: Mood normal.        Behavior: Behavior normal.        Thought Content: Thought content normal.        Judgment: Judgment normal.     Diabetic Foot Exam - Simple   Simple Foot Form Diabetic Foot exam was performed with the following findings: Yes 04/20/2023 10:03 AM  Visual Inspection No deformities, no ulcerations, no other skin breakdown bilaterally: Yes Sensation Testing See comments: Yes Pulse Check Posterior Tibialis and Dorsalis pulse intact bilaterally: Yes Comments Decreased vibration and proprioception.      Impression and Plan:  Type 2 diabetes mellitus  with other specified complication, without long-term current use of insulin (HCC) Assessment & Plan: Uncontrolled although improved with an A1c of 11.8 down from 12.7.  Increase Ozempic to 1 mg return in 3 months for follow-up.  Continue to work on lifestyle changes.  Orders: -     POCT glycosylated hemoglobin (Hb A1C) -     Semaglutide (1 MG/DOSE); Inject 1 mg as directed once a week.  Dispense: 3 mL; Refill: 2 -     Ambulatory referral to Ophthalmology  Primary hypertension Assessment & Plan: Not well-controlled.  Continue Diovan HCT 160/25 mg, add amlodipine 5 mg daily and return in 3 months for follow-up.  Orders: -     amLODIPine Besylate; Take 1 tablet (5 mg total) by mouth daily.  Dispense: 90 tablet; Refill:  1  Morbid obesity (HCC) Assessment & Plan: -Discussed healthy lifestyle, including increased physical activity and better food choices to promote weight loss.    Hyperlipidemia associated with type 2 diabetes mellitus (HCC) Assessment & Plan: Has been on atorvastatin 40 mg for 3 months, recheck lipids today.  Orders: -     Lipid panel  Immunization due  -Flu vaccine given in office today.   Time spent:33 minutes reviewing chart, interviewing and examining patient and formulating plan of care.     Chaya Jan, MD [facility]

## 2023-04-22 ENCOUNTER — Other Ambulatory Visit: Payer: Self-pay | Admitting: *Deleted

## 2023-04-22 DIAGNOSIS — E1169 Type 2 diabetes mellitus with other specified complication: Secondary | ICD-10-CM

## 2023-04-22 MED ORDER — ATORVASTATIN CALCIUM 80 MG PO TABS
80.0000 mg | ORAL_TABLET | Freq: Every day | ORAL | 3 refills | Status: DC
Start: 2023-04-22 — End: 2024-05-18

## 2023-05-20 ENCOUNTER — Other Ambulatory Visit: Payer: Self-pay | Admitting: Internal Medicine

## 2023-05-20 DIAGNOSIS — E1169 Type 2 diabetes mellitus with other specified complication: Secondary | ICD-10-CM

## 2023-07-20 ENCOUNTER — Ambulatory Visit: Payer: 59 | Admitting: Internal Medicine

## 2023-07-20 DIAGNOSIS — E1169 Type 2 diabetes mellitus with other specified complication: Secondary | ICD-10-CM

## 2023-07-22 ENCOUNTER — Encounter: Payer: Self-pay | Admitting: Internal Medicine

## 2023-07-22 ENCOUNTER — Ambulatory Visit: Payer: 59 | Admitting: Internal Medicine

## 2023-07-22 VITALS — BP 120/78 | HR 112 | Temp 98.3°F | Wt 291.9 lb

## 2023-07-22 DIAGNOSIS — E1169 Type 2 diabetes mellitus with other specified complication: Secondary | ICD-10-CM | POA: Diagnosis not present

## 2023-07-22 DIAGNOSIS — I10 Essential (primary) hypertension: Secondary | ICD-10-CM

## 2023-07-22 DIAGNOSIS — Z7985 Long-term (current) use of injectable non-insulin antidiabetic drugs: Secondary | ICD-10-CM

## 2023-07-22 DIAGNOSIS — E785 Hyperlipidemia, unspecified: Secondary | ICD-10-CM

## 2023-07-22 LAB — COMPREHENSIVE METABOLIC PANEL
ALT: 45 U/L (ref 0–53)
AST: 25 U/L (ref 0–37)
Albumin: 4.4 g/dL (ref 3.5–5.2)
Alkaline Phosphatase: 87 U/L (ref 39–117)
BUN: 14 mg/dL (ref 6–23)
CO2: 31 meq/L (ref 19–32)
Calcium: 10.1 mg/dL (ref 8.4–10.5)
Chloride: 98 meq/L (ref 96–112)
Creatinine, Ser: 0.96 mg/dL (ref 0.40–1.50)
GFR: 100.34 mL/min (ref >60.00)
Glucose, Bld: 279 mg/dL — ABNORMAL HIGH (ref 70–99)
Potassium: 4.4 meq/L (ref 3.5–5.1)
Sodium: 136 meq/L (ref 135–145)
Total Bilirubin: 0.7 mg/dL (ref 0.2–1.2)
Total Protein: 7.9 g/dL (ref 6.0–8.3)

## 2023-07-22 LAB — LIPID PANEL
Cholesterol: 174 mg/dL (ref 0–200)
HDL: 30.6 mg/dL — ABNORMAL LOW (ref >39.00)
LDL Cholesterol: 119 mg/dL — ABNORMAL HIGH (ref 0–99)
NonHDL: 143.23
Total CHOL/HDL Ratio: 6
Triglycerides: 121 mg/dL (ref 0.0–149.0)
VLDL: 24.2 mg/dL (ref 0.0–40.0)

## 2023-07-22 LAB — POCT GLYCOSYLATED HEMOGLOBIN (HGB A1C): Hemoglobin A1C: 12.5 % — AB (ref 4.0–5.6)

## 2023-07-22 MED ORDER — TIRZEPATIDE 2.5 MG/0.5ML ~~LOC~~ SOAJ: 2.5000 mg | SUBCUTANEOUS | 0 refills | Status: DC

## 2023-07-22 NOTE — Progress Notes (Signed)
Established Patient Office Visit     CC/Reason for Visit: Follow-up chronic conditions  HPI: Peter Powell is a 37 y.o. male who is coming in today for the above mentioned reasons. Past Medical History is significant for: Hypertension, hyperlipidemia, presumed type 2 diabetes and morbid obesity.  He admits to missing Ozempic doses on occasion.  States he has been compliant with amlodipine.  For some reason he tells me that he has leftover Ozempic at the end of the month and no needles to administer it.  He is on the 1 mg dose pen.  Otherwise feels well without concerns or complaints.   Past Medical/Surgical History: Past Medical History:  Diagnosis Date   DM (diabetes mellitus), type 2 (HCC)    Hypertension    Morbid obesity (HCC)     Past Surgical History:  Procedure Laterality Date   KNEE SURGERY Right 2001   screws placed below kneecap    Social History:  reports that he has never smoked. He has never used smokeless tobacco. He reports current alcohol use of about 6.0 standard drinks of alcohol per week. He reports current drug use. Drug: Marijuana.  Allergies: No Known Allergies  Family History:  History reviewed. No pertinent family history.   Current Outpatient Medications:    amLODipine (NORVASC) 5 MG tablet, Take 1 tablet (5 mg total) by mouth daily., Disp: 90 tablet, Rfl: 1   atorvastatin (LIPITOR) 80 MG tablet, Take 1 tablet (80 mg total) by mouth daily., Disp: 90 tablet, Rfl: 3   tirzepatide (MOUNJARO) 2.5 MG/0.5ML Pen, Inject 2.5 mg into the skin once a week., Disp: 2 mL, Rfl: 0   valsartan-hydrochlorothiazide (DIOVAN-HCT) 160-25 MG tablet, Take 1 tablet by mouth daily., Disp: 90 tablet, Rfl: 1  Review of Systems:  Negative unless indicated in HPI.   Physical Exam: Vitals:   07/22/23 0807  BP: 120/78  Pulse: (!) 112  Temp: 98.3 F (36.8 C)  TempSrc: Oral  SpO2: 98%  Weight: 291 lb 14.4 oz (132.4 kg)    Body mass index is 36.48  kg/m.   Physical Exam Vitals reviewed.  Constitutional:      Appearance: Normal appearance.  HENT:     Head: Normocephalic and atraumatic.  Eyes:     Conjunctiva/sclera: Conjunctivae normal.     Pupils: Pupils are equal, round, and reactive to light.  Cardiovascular:     Rate and Rhythm: Normal rate and regular rhythm.  Pulmonary:     Effort: Pulmonary effort is normal.     Breath sounds: Normal breath sounds.  Skin:    General: Skin is warm and dry.  Neurological:     General: No focal deficit present.     Mental Status: He is alert and oriented to person, place, and time.  Psychiatric:        Mood and Affect: Mood normal.        Behavior: Behavior normal.        Thought Content: Thought content normal.        Judgment: Judgment normal.      Impression and Plan:  Type 2 diabetes mellitus with other specified complication, without long-term current use of insulin (HCC) Assessment & Plan: Uncontrolled with an A1c of 12.5.  We have discussed importance of adherence to medication.  I do not think he is administering Ozempic correctly as he says that he has some medication left over at the end and no needles to use.  Because of this I will  switch him over to [facility] that has 1 pen per dose.  I will also refer him to endocrinology.  I wonder if he may need some insulin given his age.  He was diagnosed at age 48.  Orders: -     POCT glycosylated hemoglobin (Hb A1C) -     Ambulatory referral to Endocrinology -     Tirzepatide; Inject 2.5 mg into the skin once a week.  Dispense: 2 mL; Refill: 0 -     Comprehensive metabolic panel; Future  Primary hypertension Assessment & Plan: Fairly well-controlled on amlodipine 5 mg.   Hyperlipidemia associated with type 2 diabetes mellitus (HCC) Assessment & Plan: On atorvastatin 80 mg, recheck lipids today.  Orders: -     Lipid panel; Future  Morbid obesity (HCC) Assessment & Plan: -Discussed healthy lifestyle, including  increased physical activity and better food choices to promote weight loss.       Time spent:34 minutes reviewing chart, interviewing and examining patient and formulating plan of care.     Chaya Jan, MD  Primary Care at [facility]

## 2023-07-22 NOTE — Assessment & Plan Note (Signed)
On atorvastatin 80 mg, recheck lipids today.

## 2023-07-22 NOTE — Assessment & Plan Note (Signed)
Discussed healthy lifestyle, including increased physical activity and better food choices to promote weight loss.  

## 2023-07-22 NOTE — Assessment & Plan Note (Signed)
Uncontrolled with an A1c of 12.5.  We have discussed importance of adherence to medication.  I do not think he is administering Ozempic correctly as he says that he has some medication left over at the end and no needles to use.  Because of this I will switch him over to [facility] that has 1 pen per dose.  I will also refer him to endocrinology.  I wonder if he may need some insulin given his age.  He was diagnosed at age 37.

## 2023-07-22 NOTE — Assessment & Plan Note (Signed)
Fairly well-controlled on amlodipine 5 mg.

## 2023-07-26 ENCOUNTER — Other Ambulatory Visit: Payer: Self-pay | Admitting: Internal Medicine

## 2023-07-26 DIAGNOSIS — E1169 Type 2 diabetes mellitus with other specified complication: Secondary | ICD-10-CM

## 2023-07-26 MED ORDER — EZETIMIBE 10 MG PO TABS
10.0000 mg | ORAL_TABLET | Freq: Every day | ORAL | 1 refills | Status: DC
Start: 2023-07-26 — End: 2024-01-24

## 2023-07-30 ENCOUNTER — Other Ambulatory Visit: Payer: Self-pay | Admitting: Internal Medicine

## 2023-07-30 DIAGNOSIS — I10 Essential (primary) hypertension: Secondary | ICD-10-CM

## 2023-08-16 ENCOUNTER — Other Ambulatory Visit: Payer: Self-pay | Admitting: Internal Medicine

## 2023-08-16 DIAGNOSIS — E1169 Type 2 diabetes mellitus with other specified complication: Secondary | ICD-10-CM

## 2023-08-24 ENCOUNTER — Telehealth: Payer: Self-pay

## 2023-08-24 NOTE — Telephone Encounter (Signed)
 Copied from CRM [id number]. Topic: General - Other >> Aug 24, 2023 10:47 AM Florestine Avers wrote: Reason for CRM: Patient called in saying he had a missed called from Dr. Ardyth Harps nurse.

## 2023-08-24 NOTE — Telephone Encounter (Signed)
 Has he tolerated well? If so, would like to increase to 5 mg dose.  Left message on machine for patient.

## 2023-08-24 NOTE — Telephone Encounter (Signed)
 Left message on machine for patient to return our call

## 2023-08-25 ENCOUNTER — Encounter: Payer: Self-pay | Admitting: Internal Medicine

## 2023-08-25 ENCOUNTER — Other Ambulatory Visit: Payer: Self-pay | Admitting: Internal Medicine

## 2023-08-25 DIAGNOSIS — E1169 Type 2 diabetes mellitus with other specified complication: Secondary | ICD-10-CM

## 2023-08-25 MED ORDER — TIRZEPATIDE 5 MG/0.5ML ~~LOC~~ SOAJ: 5.0000 mg | SUBCUTANEOUS | 0 refills | Status: DC

## 2023-08-25 NOTE — Telephone Encounter (Signed)
 Refill has been sent.

## 2023-09-03 ENCOUNTER — Telehealth: Payer: 59 | Admitting: Emergency Medicine

## 2023-09-03 DIAGNOSIS — J069 Acute upper respiratory infection, unspecified: Secondary | ICD-10-CM

## 2023-09-03 MED ORDER — FLUTICASONE PROPIONATE 50 MCG/ACT NA SUSP
2.0000 | Freq: Every day | NASAL | 0 refills | Status: DC
Start: 2023-09-03 — End: 2023-11-16

## 2023-09-03 MED ORDER — BENZONATATE 100 MG PO CAPS
100.0000 mg | ORAL_CAPSULE | Freq: Two times a day (BID) | ORAL | 0 refills | Status: DC | PRN
Start: 2023-09-03 — End: 2023-11-16

## 2023-09-03 MED ORDER — FLUTICASONE PROPIONATE 50 MCG/ACT NA SUSP: 2.0000 | Freq: Every day | NASAL | 0 refills | Status: DC

## 2023-09-03 NOTE — Addendum Note (Signed)
Addended by: Margaretann Loveless on: 09/03/2023 02:47 PM   Modules accepted: Orders

## 2023-09-03 NOTE — Progress Notes (Signed)

## 2023-09-14 NOTE — Progress Notes (Signed)
 [facility] - Clinic Note  09/21/2023   CHIEF COMPLAINT Patient presents for Retina Evaluation  HISTORY OF PRESENT ILLNESS: Peter Powell is a 38 y.o. male who presents to the clinic today for:  HPI     Retina Evaluation   In both eyes.  This started 8 years ago.  Associated Symptoms Floaters.  Negative for Flashes.  Context:  distance vision and driving.  I, the attending physician,  performed the HPI with the patient and updated documentation appropriately.        Comments   Patient is here today based on a referral from Dr. [name] for Diabetes. He is using AT's. He does not check his blood sugar.      Last edited by Valdemar Rogue, MD on 09/21/2023  1:11 PM.    Pt is here on the referral of Dr. [name] for DM exam, pt states he has been diabetic for 9 years, he is on Mounjaro , his last A1c was 12.5 on 12.05.24, pt denies any kidney problems or peripheral neuropathy, he denies any vision / eye problems, he does not check his blood sugar   Referring physician: Frazier, Chad, OD [address] [name],  [name] [address]  HISTORICAL INFORMATION:  Selected notes from the MEDICAL RECORD NUMBER Referred by Dr. [name] for concern of NPDR LEE:  Ocular Hx- PMH-   CURRENT MEDICATIONS: No current outpatient medications on file. (Ophthalmic Drugs)   No current facility-administered medications for this visit. (Ophthalmic Drugs)   Current Outpatient Medications (Other)  Medication Sig   amLODipine  (NORVASC ) 5 MG tablet Take 1 tablet (5 mg total) by mouth daily.   atorvastatin  (LIPITOR) 80 MG tablet Take 1 tablet (80 mg total) by mouth daily.   benzonatate  (TESSALON ) 100 MG capsule Take 1 capsule (100 mg total) by mouth 2 (two) times daily as needed for cough.   ezetimibe  (ZETIA ) 10 MG tablet Take 1 tablet (10 mg total) by mouth daily.   fluticasone  (FLONASE ) 50 MCG/ACT nasal spray Place 2 sprays into both nostrils daily.   tirzepatide  (MOUNJARO ) 2.5  MG/0.5ML Pen Inject 2.5 mg into the skin once a week.   tirzepatide  (MOUNJARO ) 5 MG/0.5ML Pen Inject 5 mg into the skin once a week.   valsartan -hydrochlorothiazide  (DIOVAN -HCT) 160-25 MG tablet TAKE 1 TABLET BY MOUTH EVERY DAY   No current facility-administered medications for this visit. (Other)   REVIEW OF SYSTEMS: ROS   Positive for: Endocrine, Eyes Last edited by Myra Wanda SAILOR, COT on 09/21/2023  8:06 AM.     ALLERGIES No Known Allergies PAST MEDICAL HISTORY Past Medical History:  Diagnosis Date   DM (diabetes mellitus), type 2 (HCC)    Hypertension    Morbid obesity (HCC)    Past Surgical History:  Procedure Laterality Date   KNEE SURGERY Right 2001   screws placed below kneecap   FAMILY HISTORY History reviewed. No pertinent family history. SOCIAL HISTORY Social History   Tobacco Use   Smoking status: Never   Smokeless tobacco: Never  Substance Use Topics   Alcohol use: Yes    Alcohol/week: 6.0 standard drinks of alcohol    Types: 6 Standard drinks or equivalent per week    Comment: occasional   Drug use: Yes    Types: Marijuana       OPHTHALMIC EXAM:  Base Eye Exam     Visual Acuity (Snellen - Linear)       Right Left   Dist [place] 20/25 +2 20/20  Dist ph [place] NI          Tonometry (Tonopen, 8:09 AM)       Right Left   Pressure 16 18         Pupils       Dark Light Shape React APD   Right 3 3 Round Minimal None   Left 3 3 Round Minimal None         Visual Fields       Left Right    Full Full         Extraocular Movement       Right Left    Full, Ortho Full, Ortho         Neuro/Psych     Oriented x3: Yes         Dilation     Both eyes: 1.0% Mydriacyl, 2.5% Phenylephrine @ 8:06 AM           Slit Lamp and Fundus Exam     Slit Lamp Exam       Right Left   Lids/Lashes Normal Normal   Conjunctiva/Sclera Mild pingeucula Mild pingeucula   Cornea Clear Clear   Anterior Chamber deep and clear deep and clear    Iris Round and dilated, No NVI Round and dilated, No NVI   Lens Clear Clear   Anterior Vitreous mild syneresis mild syneresis         Fundus Exam       Right Left   Disc Pink and Sharp, mild PPP Pink and Sharp, no NVD   C/D Ratio 0.4 0.4   Macula Flat, Blunted foveal reflex, scattered MA/DBH and punctate exudates, CWS inferior mac Flat, Blunted foveal reflex, scattered MA/DBH and punctate exudates   Vessels attenuated, copper wiring, AV crossing changes, dilated and tortuous venules Attenuated arterioles and copper wiring, dilated and tortuous venules, early beading, focal flame hemes along temporal arcades   Periphery Attached, 360 MA/DBH and punctate exudates greatest posteriorly Attached, 360 MA/DBH and punctate exudates greatest posteriorly           IMAGING AND PROCEDURES  Imaging and Procedures for 09/21/2023  OCT, Retina - OU - Both Eyes       Right Eye Quality was good. Central Foveal Thickness: 284. Progression has no prior data. Findings include normal foveal contour, no SRF, intraretinal hyper-reflective material, intraretinal fluid, vitreomacular adhesion (Scattered cystic changes greatest temporal macula).   Left Eye Quality was good. Central Foveal Thickness: 321. Progression has no prior data. Findings include no SRF, abnormal foveal contour, intraretinal hyper-reflective material, intraretinal fluid, vitreomacular adhesion (Scattered cystic changes greatest temporal macula and fovea).   Notes *Images captured and stored on drive  Diagnosis / Impression:  OD: Scattered cystic changes greatest temporal macula  OS: Scattered cystic changes greatest temporal macula and fovea  Clinical management:  See below  Abbreviations: NFP - Normal foveal profile. CME - cystoid macular edema. PED - pigment epithelial detachment. IRF - intraretinal fluid. SRF - subretinal fluid. EZ - ellipsoid zone. ERM - epiretinal membrane. ORA - outer retinal atrophy. ORT - outer retinal  tubulation. SRHM - subretinal hyper-reflective material. IRHM - intraretinal hyper-reflective material      Fluorescein  Angiography Optos (Transit OD)       Right Eye Progression has no prior data. Early phase findings include delayed filling, microaneurysm. Mid/Late phase findings include leakage, microaneurysm (No NV).   Left Eye Progression has no prior data. Early phase findings include microaneurysm. Mid/Late phase findings include leakage, microaneurysm (  No NV).   Notes **Images stored on drive**  Impression: Moderate NPDR OU Scattered late leaking MA 360 OU No NV OU            ASSESSMENT/PLAN:   ICD-10-CM   1. Moderate nonproliferative diabetic retinopathy of both eyes with macular edema associated with type 2 diabetes mellitus (HCC)  E11.3313 OCT, Retina - OU - Both Eyes    2. Long-term (current) use of injectable non-insulin  antidiabetic drugs  Z79.85     3. Essential hypertension  I10     4. Hypertensive retinopathy of both eyes  H35.033 Fluorescein  Angiography Optos (Transit OD)     1,2. Moderate non-proliferative diabetic retinopathy, both eyes  - A1c: 12.5 on 12.05.25 - The incidence, risk factors for progression, natural history and treatment options for diabetic retinopathy were discussed with patient.   - The need for close monitoring of blood glucose, blood pressure, and serum lipids, avoiding cigarette or any type of tobacco, and the need for long term follow up was also discussed with patient. - exam shows scattered MA OU, no NV OU - FA (02.04.25) shows late leaking MA's, no NV OU - OCT shows diabetic macular edema, both eyes  - The natural history, pathology, and characteristics of diabetic macular edema discussed with patient.  A generalized discussion of the major clinical trials concerning treatment of diabetic macular edema (ETDRS, DCT, SCORE, RISE / RIDE, and ongoing DRCR net studies) was completed.  This discussion included mention of the  various approaches to treating diabetic macular edema (observation, laser photocoagulation, anti-VEGF injections with lucentis / Avastin  / Eylea, steroid injections with Kenalog / Ozurdex, and intraocular surgery with vitrectomy).  The goal hemoglobin A1C of 6-7 was discussed, as well as importance of smoking cessation and hypertension control.  Need for ongoing treatment and monitoring were specifically discussed with reference to chronic nature of diabetic macular edema. - BCVA OD 20/25; OS 20/20 - no treatment recommended today -- will monitor for now - f/u in 3-4 months -- DFE/OCT, possible injection  3,4. Hypertensive retinopathy OU - discussed importance of tight BP control - monitor   Ophthalmic Meds Ordered this visit:  No orders of the defined types were placed in this encounter.    Return for f/u 3-4 months, NPDR OU, DFE, OCT.  There are no Patient Instructions on file for this visit.  Explained the diagnoses, plan, and follow up with the patient and they expressed understanding.  Patient expressed understanding of the importance of proper follow up care.   This document serves as a record of services personally performed by Redell JUDITHANN Hans, MD, PhD. It was created on their behalf by Wanda GEANNIE Keens, COT an ophthalmic technician. The creation of this record is the provider's dictation and/or activities during the visit.    Electronically signed by:  Wanda GEANNIE Keens, COT  09/21/23 1:17 PM  Redell JUDITHANN Hans, M.D., Ph.D. Diseases & Surgery of the Retina and Vitreous [facility] 09/21/2023  I have reviewed the above documentation for accuracy and completeness, and I agree with the above. Redell JUDITHANN Hans, M.D., Ph.D. 09/21/23 1:17 PM    Abbreviations: M myopia (nearsighted); A astigmatism; H hyperopia (farsighted); P presbyopia; Mrx spectacle prescription;  CTL contact lenses; OD right eye; OS left eye; OU both eyes  XT exotropia; ET esotropia; PEK  punctate epithelial keratitis; PEE punctate epithelial erosions; DES dry eye syndrome; MGD meibomian gland dysfunction; ATs artificial tears; PFAT's preservative free artificial tears; NSC  nuclear sclerotic cataract; PSC posterior subcapsular cataract; ERM epi-retinal membrane; PVD posterior vitreous detachment; RD retinal detachment; DM diabetes mellitus; DR diabetic retinopathy; NPDR non-proliferative diabetic retinopathy; PDR proliferative diabetic retinopathy; CSME clinically significant macular edema; DME diabetic macular edema; dbh dot blot hemorrhages; CWS cotton wool spot; POAG primary open angle glaucoma; C/D cup-to-disc ratio; HVF humphrey visual field; GVF goldmann visual field; OCT optical coherence tomography; IOP intraocular pressure; BRVO Branch retinal vein occlusion; CRVO central retinal vein occlusion; CRAO central retinal artery occlusion; BRAO branch retinal artery occlusion; RT retinal tear; SB scleral buckle; PPV pars plana vitrectomy; VH Vitreous hemorrhage; PRP panretinal laser photocoagulation; IVK intravitreal kenalog; VMT vitreomacular traction; MH Macular hole;  NVD neovascularization of the disc; NVE neovascularization elsewhere; AREDS age related eye disease study; ARMD age related macular degeneration; POAG primary open angle glaucoma; EBMD epithelial/anterior basement membrane dystrophy; ACIOL anterior chamber intraocular lens; IOL intraocular lens; PCIOL posterior chamber intraocular lens; Phaco/IOL phacoemulsification with intraocular lens placement; PRK photorefractive keratectomy; LASIK laser assisted in situ keratomileusis; HTN hypertension; DM diabetes mellitus; COPD chronic obstructive pulmonary disease

## 2023-09-21 ENCOUNTER — Encounter (INDEPENDENT_AMBULATORY_CARE_PROVIDER_SITE_OTHER): Payer: Self-pay | Admitting: Ophthalmology

## 2023-09-21 ENCOUNTER — Ambulatory Visit (INDEPENDENT_AMBULATORY_CARE_PROVIDER_SITE_OTHER): Payer: 59 | Admitting: Ophthalmology

## 2023-09-21 VITALS — BP 112/78 | HR 108

## 2023-09-21 DIAGNOSIS — I10 Essential (primary) hypertension: Secondary | ICD-10-CM | POA: Diagnosis not present

## 2023-09-21 DIAGNOSIS — Z7985 Long-term (current) use of injectable non-insulin antidiabetic drugs: Secondary | ICD-10-CM

## 2023-09-21 DIAGNOSIS — H3581 Retinal edema: Secondary | ICD-10-CM

## 2023-09-21 DIAGNOSIS — H35033 Hypertensive retinopathy, bilateral: Secondary | ICD-10-CM | POA: Diagnosis not present

## 2023-09-21 DIAGNOSIS — E113313 Type 2 diabetes mellitus with moderate nonproliferative diabetic retinopathy with macular edema, bilateral: Secondary | ICD-10-CM | POA: Diagnosis not present

## 2023-09-24 ENCOUNTER — Encounter: Payer: Self-pay | Admitting: Internal Medicine

## 2023-09-24 ENCOUNTER — Telehealth: Payer: Self-pay

## 2023-09-24 ENCOUNTER — Other Ambulatory Visit (HOSPITAL_COMMUNITY): Payer: Self-pay

## 2023-09-24 NOTE — Telephone Encounter (Signed)
 Pharmacy Patient Advocate Encounter   Received notification from Patient Advice Request messages that prior authorization for MOUNJARO  is required/requested.   Insurance verification completed.   The patient is insured through  [employer] [facility]  .   Per test claim: The current 28 day co-pay is, $35.  No PA needed at this time. This test claim was processed through [facility]- copay amounts may vary at other pharmacies due to pharmacy/plan contracts, or as the patient moves through the different stages of their insurance plan.

## 2023-09-27 ENCOUNTER — Other Ambulatory Visit: Payer: Self-pay | Admitting: Internal Medicine

## 2023-09-27 DIAGNOSIS — E1169 Type 2 diabetes mellitus with other specified complication: Secondary | ICD-10-CM

## 2023-09-27 MED ORDER — TIRZEPATIDE 7.5 MG/0.5ML ~~LOC~~ SOAJ: 7.5000 mg | SUBCUTANEOUS | 0 refills | Status: DC

## 2023-09-27 NOTE — Telephone Encounter (Addendum)
 Increase dosage please   [name] to P [facility] (supporting Raeanne Bull, MD)      08/25/23 11:11 AM Yes.  Me to [name]      08/25/23 11:08 AM Hello, Are you tolerating the medication well? thanks

## 2023-10-21 ENCOUNTER — Other Ambulatory Visit: Payer: Self-pay | Admitting: Internal Medicine

## 2023-10-21 DIAGNOSIS — E1169 Type 2 diabetes mellitus with other specified complication: Secondary | ICD-10-CM

## 2023-10-21 MED ORDER — TIRZEPATIDE 7.5 MG/0.5ML ~~LOC~~ SOAJ: 7.5000 mg | SUBCUTANEOUS | 0 refills | Status: DC

## 2023-11-10 ENCOUNTER — Other Ambulatory Visit: Payer: Self-pay | Admitting: Internal Medicine

## 2023-11-10 DIAGNOSIS — I10 Essential (primary) hypertension: Secondary | ICD-10-CM

## 2023-11-16 ENCOUNTER — Encounter: Payer: Self-pay | Admitting: Endocrinology

## 2023-11-16 ENCOUNTER — Ambulatory Visit: Payer: 59 | Admitting: Endocrinology

## 2023-11-16 VITALS — BP 112/82 | HR 108 | Resp 16 | Ht 75.0 in | Wt 293.0 lb

## 2023-11-16 DIAGNOSIS — E1169 Type 2 diabetes mellitus with other specified complication: Secondary | ICD-10-CM

## 2023-11-16 DIAGNOSIS — Z7984 Long term (current) use of oral hypoglycemic drugs: Secondary | ICD-10-CM

## 2023-11-16 DIAGNOSIS — E1165 Type 2 diabetes mellitus with hyperglycemia: Secondary | ICD-10-CM

## 2023-11-16 DIAGNOSIS — Z7985 Long-term (current) use of injectable non-insulin antidiabetic drugs: Secondary | ICD-10-CM | POA: Diagnosis not present

## 2023-11-16 LAB — POCT GLYCOSYLATED HEMOGLOBIN (HGB A1C): Hemoglobin A1C: 11.1 % — AB (ref 4.0–5.6)

## 2023-11-16 MED ORDER — TIRZEPATIDE 10 MG/0.5ML ~~LOC~~ SOAJ: 10.0000 mg | SUBCUTANEOUS | 3 refills | Status: DC

## 2023-11-16 MED ORDER — BLOOD GLUCOSE MONITORING SUPPL DEVI: 1.0000 | Freq: Three times a day (TID) | 0 refills | Status: AC

## 2023-11-16 MED ORDER — GLIMEPIRIDE 4 MG PO TABS: 4.0000 mg | ORAL_TABLET | Freq: Every day | ORAL | 3 refills | Status: DC

## 2023-11-16 MED ORDER — LANCETS MISC. MISC: 1.0000 | Freq: Three times a day (TID) | 3 refills | Status: AC

## 2023-11-16 MED ORDER — LANCET DEVICE MISC: 1.0000 | Freq: Three times a day (TID) | 0 refills | Status: AC

## 2023-11-16 MED ORDER — BLOOD GLUCOSE TEST VI STRP: 1.0000 | ORAL_STRIP | Freq: Three times a day (TID) | 3 refills | Status: AC

## 2023-11-16 NOTE — Patient Instructions (Addendum)
 Diabetes mellitus regimen:  Increase Mounjaro to 10 mg weekly.   Start Glimepiride 4 mg daily, take with breakfast.  Check glucose in the morning fasting  , goal, < 150  and at bedtime, goal < 200. Bring glucometer in the follow up visit.   Call me in 1 months, if you have no stomach issue, we can increase Mounjaro to 12.5 mcg daily.   Referred to dietician.

## 2023-11-16 NOTE — Progress Notes (Signed)
 Outpatient Endocrinology Note Peter Yoshiko Keleher, MD   Patient's Name: Peter Powell    DOB: Jun 12, 1986    MRN: [id number]                                                    REASON OF VISIT: New consult  for type 2 diabetes mellitus  REFERRING PROVIDER: Philip Aspen, Limmie Patricia, MD   PCP: Philip Aspen, Limmie Patricia, MD  HISTORY OF PRESENT ILLNESS:   Peter Powell is a 38 y.o. old male with past medical history listed below, is here for new consult  for type 2 diabetes mellitus.   Pertinent Diabetes History: Patient was referred to endocrinology for evaluation and management of uncontrolled type 2 diabetes mellitus, initial consult in November 16, 2023.  He was diagnosed with type 2 diabetes mellitus in January 2015, hemoglobin A1c at the time of diagnosis was 11.3%.  He has uncontrolled type 2 diabetes mellitus with hemoglobin A1c in the range of 11.8 to 12.7% range.  Patient reports he was not taking care of his health for a long time in the past.  He had been on metformin in the past.  He does not recall side effect related to metformin.  He was on Ozempic in the past started in June 2024 and switch to [facility] in December 2024.  No history of diabetes ketoacidosis.  Previous diabetes education: No  No personal history of pancreatitis and / or family history of medullary thyroid carcinoma or MEN 2B syndrome.   Chronic Diabetes Complications : Retinopathy: yes. Last ophthalmology exam was done on 09/2023, following with ophthalmology regularly.  He has moderate nonproliferative diabetic retinopathy of both eyes with macular edema. Nephropathy: no, on ACE/ARB /valsartan. Peripheral neuropathy: no Coronary artery disease: no Stroke: on  Relevant comorbidities and cardiovascular risk factors: Obesity: yes Body mass index is 36.62 kg/m.  Hypertension: Yes  Hyperlipidemia : Yes, on statin   Current / Home Diabetic regimen includes:  Mounjaro 7.5 mg weekly, beginning of October 27, 2023.  Prior  diabetic medications: Metformin and Ozempic.  Patient does not recall side effect related to metformin and Ozempic in the past.  Glycemic data:   He has not been checking blood sugar at home.  Hypoglycemia: Patient has no hypoglycemic episodes. Patient has hypoglycemia awareness.   Factors modifying glucose control: 1.  Diabetic diet assessment: 2-3 meals a day.  Rice, sandwiches.  Stop drinking regular sodas.  2.  Staying active or exercising: Walking.  3.  Medication compliance: compliant all of the time.  Interval history  Patient has been taking Mounjaro 7.5 mg weekly, denies any GI issues nausea or vomiting.  Tolerating well.  Hemoglobin A1c today mildly improved to 11.1% from 12.5%.  He complains of erectile dysfunction.  Discussed that improvement of diabetes may help with improving erectile dysfunction as well.  Consider discussing with primary care provider for ED medication.  Denies numbness and tingling of the feet and extremities.  He has diabetic retinopathy as noted above.  No other complaints today.  REVIEW OF SYSTEMS As per history of present illness.   PAST MEDICAL HISTORY: Past Medical History:  Diagnosis Date   DM (diabetes mellitus), type 2 (HCC)    Hypertension    Morbid obesity (HCC)     PAST SURGICAL HISTORY: Past Surgical History:  Procedure Laterality Date  KNEE SURGERY Right 2001   screws placed below kneecap    ALLERGIES: No Known Allergies  FAMILY HISTORY:  History reviewed. No pertinent family history.  SOCIAL HISTORY: Social History   Socioeconomic History   Marital status: Single    Spouse name: Not on file   Number of children: Not on file   Years of education: Not on file   Highest education level: Some college, no degree  Occupational History   Not on file  Tobacco Use   Smoking status: Never    Passive exposure: Past   Smokeless tobacco: Never  Vaping Use   Vaping status: Never Used  Substance and Sexual Activity    Alcohol use: Yes    Alcohol/week: 6.0 standard drinks of alcohol    Types: 6 Standard drinks or equivalent per week    Comment: occasional   Drug use: Yes    Types: Marijuana   Sexual activity: Not on file  Other Topics Concern   Not on file  Social History Narrative   Not on file   Social Drivers of Health   Financial Resource Strain: Low Risk  (07/21/2023)   Overall Financial Resource Strain (CARDIA)    Difficulty of Paying Living Expenses: Not hard at all  Food Insecurity: No Food Insecurity (07/21/2023)   Hunger Vital Sign    Worried About Running Out of Food in the Last Year: Never true    Ran Out of Food in the Last Year: Never true  Transportation Needs: No Transportation Needs (07/21/2023)   PRAPARE - Administrator, Civil Service (Medical): No    Lack of Transportation (Non-Medical): No  Physical Activity: Insufficiently Active (07/21/2023)   Exercise Vital Sign    Days of Exercise per Week: 3 days    Minutes of Exercise per Session: 20 min  Stress: No Stress Concern Present (07/21/2023)   [employer] of Occupational Health - Occupational Stress Questionnaire    Feeling of Stress : Not at all  Social Connections: Moderately Isolated (07/21/2023)   Social Connection and Isolation Panel [NHANES]    Frequency of Communication with Friends and Family: Three times a week    Frequency of Social Gatherings with Friends and Family: Once a week    Attends Religious Services: 1 to 4 times per year    Active Member of [employer] or Organizations: No    Attends Engineer, structural: Not on file    Marital Status: Never married    MEDICATIONS:  Current Outpatient Medications  Medication Sig Dispense Refill   amLODipine (NORVASC) 5 MG tablet TAKE 1 TABLET(5 MG) BY MOUTH DAILY 90 tablet 0   atorvastatin (LIPITOR) 80 MG tablet Take 1 tablet (80 mg total) by mouth daily. 90 tablet 3   Blood Glucose Monitoring Suppl DEVI 1 each by Does not apply route in the  morning, at noon, and at bedtime. May substitute to any manufacturer covered by patient's insurance. 1 each 0   ezetimibe (ZETIA) 10 MG tablet Take 1 tablet (10 mg total) by mouth daily. 90 tablet 1   glimepiride (AMARYL) 4 MG tablet Take 1 tablet (4 mg total) by mouth daily before breakfast. 90 tablet 3   Glucose Blood (BLOOD GLUCOSE TEST STRIPS) STRP 1 each by In Vitro route in the morning, at noon, and at bedtime. May substitute to any manufacturer covered by patient's insurance. 100 each 3   Lancet Device MISC 1 each by Does not apply route in the morning, at noon,  and at bedtime. May substitute to any manufacturer covered by patient's insurance. 1 each 0   Lancets Misc. MISC 1 each by Does not apply route in the morning, at noon, and at bedtime. May substitute to any manufacturer covered by patient's insurance. 100 each 3   tirzepatide (MOUNJARO) 10 MG/0.5ML Pen Inject 10 mg into the skin once a week. 6 mL 3   valsartan-hydrochlorothiazide (DIOVAN-HCT) 160-25 MG tablet TAKE 1 TABLET BY MOUTH EVERY DAY 90 tablet 1   No current facility-administered medications for this visit.    PHYSICAL EXAM: Vitals:   11/16/23 0812  BP: 112/82  Pulse: (!) 108  Resp: 16  SpO2: 99%  Weight: 293 lb (132.9 kg)  Height: 6\' 3"  (1.905 m)   Body mass index is 36.62 kg/m.  Wt Readings from Last 3 Encounters:  11/16/23 293 lb (132.9 kg)  07/22/23 291 lb 14.4 oz (132.4 kg)  04/20/23 294 lb 8 oz (133.6 kg)    General: Well developed, well nourished male in no apparent distress.  HEENT: AT/[place], no external lesions.  Eyes: Conjunctiva clear and no icterus. Neck: Neck supple  Lungs: Respirations not labored Neurologic: Alert, oriented, normal speech Extremities / Skin: Dry. No sores or rashes noted.  Psychiatric: Does not appear depressed or anxious   Diabetic Foot Exam - Simple   No data filed    LABS Reviewed Lab Results  Component Value Date   HGBA1C 11.1 (A) 11/16/2023   HGBA1C 12.5 (A)  07/22/2023   HGBA1C 11.8 (A) 04/20/2023   No results found for: "FRUCTOSAMINE" Lab Results  Component Value Date   CHOL 174 07/22/2023   HDL 30.60 (L) 07/22/2023   LDLCALC 119 (H) 07/22/2023   TRIG 121.0 07/22/2023   CHOLHDL 6 07/22/2023   Lab Results  Component Value Date   MICRALBCREAT 2.3 01/18/2023   Lab Results  Component Value Date   CREATININE 0.96 07/22/2023   Lab Results  Component Value Date   GFR 100.34 07/22/2023    ASSESSMENT / PLAN  1. Type 2 diabetes mellitus with other specified complication, without long-term current use of insulin (HCC)   2. Uncontrolled type 2 diabetes mellitus with hyperglycemia (HCC)     Diabetes Mellitus type 2, complicated by diabetic retinopathy. - Diabetic status / severity: Uncontrolled, improving.  Lab Results  Component Value Date   HGBA1C 11.1 (A) 11/16/2023    - Hemoglobin A1c goal : <6.5%  Discussed about type 2 diabetes mellitus and potential chronic complications including retinopathy, neuropathy and nephropathy.  Discussed about importance of controlling blood sugar to prevent complications.  Discussed in detail about lifestyle modification mainly the diet control and routine exercise.  Advised about reducing portion especially rice and breads.    He is currently on Mounjaro.  Ideally insulin therapy would be the most effective however we will try to manage with non-insulin medications and adjusted diabetes regimen as follows.  - Medications: See below.  I) increase Mounjaro from 7.5 to 10 mg weekly. II) start glimepiride 4 mg daily. III) will add metformin after maximizing dose of Mounjaro and consider stopping glimepiride at that time.  - Home glucose testing: Check blood sugar in the morning fasting and at bedtime.  Sent prescription for glucometer and test supplies.  Discussed about option of CGM, he does not want to be a CGM at this time. - Discussed/ Gave Hypoglycemia treatment plan.  # Consult : Refer to  diabetic educator/dietitian.  # Annual urine for microalbuminuria/ creatinine ratio, no microalbuminuria  currently, continue ACE/ARB /valsartan. Last  Lab Results  Component Value Date   MICRALBCREAT 2.3 01/18/2023    # Foot check nightly / neuropathy.  #.  Diabetic retinopathy, following with ophthalmology.  - Diet: Make healthy diabetic food choices, discussed in detail. - Life style / activity / exercise: Discussed.  2. Blood pressure  -  BP Readings from Last 1 Encounters:  11/16/23 112/82    - Control is in target.  - No change in current plans.  3. Lipid status / Hyperlipidemia - Last  Lab Results  Component Value Date   LDLCALC 119 (H) 07/22/2023   - Continue atorvastatin 80 mg daily, managed by PCP.  Shayaan "Soundra Pilon" was seen today for new patient (initial visit).  Diagnoses and all orders for this visit:  Type 2 diabetes mellitus with other specified complication, without long-term current use of insulin (HCC) -     POCT glycosylated hemoglobin (Hb A1C) -     glimepiride (AMARYL) 4 MG tablet; Take 1 tablet (4 mg total) by mouth daily before breakfast. -     tirzepatide (MOUNJARO) 10 MG/0.5ML Pen; Inject 10 mg into the skin once a week. -     Blood Glucose Monitoring Suppl DEVI; 1 each by Does not apply route in the morning, at noon, and at bedtime. May substitute to any manufacturer covered by patient's insurance. -     Glucose Blood (BLOOD GLUCOSE TEST STRIPS) STRP; 1 each by In Vitro route in the morning, at noon, and at bedtime. May substitute to any manufacturer covered by patient's insurance. -     Lancet Device MISC; 1 each by Does not apply route in the morning, at noon, and at bedtime. May substitute to any manufacturer covered by patient's insurance. -     Lancets Misc. MISC; 1 each by Does not apply route in the morning, at noon, and at bedtime. May substitute to any manufacturer covered by patient's insurance. -     Amb Referral to Nutrition and Diabetic  Education  Uncontrolled type 2 diabetes mellitus with hyperglycemia (HCC)    DISPOSITION Follow up in clinic in 3 months suggested.   All questions answered and patient verbalized understanding of the plan.  Peter Tresean Mattix, MD [facility] Endocrinology [facility] [address] [place], [address] [place], [name] [address] Phone # [phone]  At least part of this note was generated using voice recognition software. Inadvertent word errors may have occurred, which were not recognized during the proofreading process.

## 2023-12-22 ENCOUNTER — Telehealth: Payer: Self-pay

## 2023-12-22 ENCOUNTER — Other Ambulatory Visit (HOSPITAL_COMMUNITY): Payer: Self-pay

## 2023-12-22 NOTE — Telephone Encounter (Signed)
 Pharmacy Patient Advocate Encounter   Received notification from CoverMyMeds that prior authorization for Ozempic  0.25 is required/requested.   Insurance verification completed.   The patient is insured through [facility] MEDICAID .   Per test claim: PA required; PA submitted to above mentioned insurance via CoverMyMeds Key/confirmation #/EOC [id number] Status is pending

## 2023-12-23 ENCOUNTER — Other Ambulatory Visit (HOSPITAL_COMMUNITY): Payer: Self-pay

## 2023-12-23 NOTE — Telephone Encounter (Signed)
 Pharmacy Patient Advocate Encounter  Received notification from [place] [facility] MEDICAID that Prior Authorization for Ozempic  has been Approved from 12/22/23-12/21/24. Unable to run test claim due to refill too soon. Last fill date 12/20/23   PA #/Case ID/Reference #: [id number]

## 2024-01-04 ENCOUNTER — Ambulatory Visit: Admitting: Skilled Nursing Facility1

## 2024-01-06 NOTE — Progress Notes (Shared)
 [facility] - Clinic Note  01/18/2024   CHIEF COMPLAINT Patient presents for No chief complaint on file.  HISTORY OF PRESENT ILLNESS: Peter Powell is a 38 y.o. male who presents to the clinic today for:   Pt is here on the referral of Dr. Micael Adas for DM exam, pt states he has been diabetic for 9 years, he is on Mounjaro, his last A1c was 12.5 on 12.05.24, pt denies any kidney problems or peripheral neuropathy, he denies any vision / eye problems, he does not check his blood sugar   Referring physician: Frazier, [place], OD [address] [place],  [name] [address]  HISTORICAL INFORMATION:  Selected notes from the MEDICAL RECORD NUMBER Referred by Dr. Micael Adas for concern of NPDR LEE:  Ocular Hx- PMH-   CURRENT MEDICATIONS: No current outpatient medications on file. (Ophthalmic Drugs)   No current facility-administered medications for this visit. (Ophthalmic Drugs)   Current Outpatient Medications (Other)  Medication Sig   amLODipine  (NORVASC ) 5 MG tablet TAKE 1 TABLET(5 MG) BY MOUTH DAILY   atorvastatin  (LIPITOR) 80 MG tablet Take 1 tablet (80 mg total) by mouth daily.   Blood Glucose Monitoring Suppl DEVI 1 each by Does not apply route in the morning, at noon, and at bedtime. May substitute to any manufacturer covered by patient's insurance.   ezetimibe  (ZETIA ) 10 MG tablet Take 1 tablet (10 mg total) by mouth daily.   glimepiride  (AMARYL ) 4 MG tablet Take 1 tablet (4 mg total) by mouth daily before breakfast.   Glucose Blood (BLOOD GLUCOSE TEST STRIPS) STRP 1 each by In Vitro route in the morning, at noon, and at bedtime. May substitute to any manufacturer covered by patient's insurance.   tirzepatide (MOUNJARO) 10 MG/0.5ML Pen Inject 10 mg into the skin once a week.   valsartan -hydrochlorothiazide  (DIOVAN -HCT) 160-25 MG tablet TAKE 1 TABLET BY MOUTH EVERY DAY   No current facility-administered medications for this visit. (Other)   REVIEW OF  SYSTEMS:   ALLERGIES No Known Allergies PAST MEDICAL HISTORY Past Medical History:  Diagnosis Date   DM (diabetes mellitus), type 2 (HCC)    Hypertension    Morbid obesity (HCC)    Past Surgical History:  Procedure Laterality Date   KNEE SURGERY Right 2001   screws placed below kneecap   FAMILY HISTORY No family history on file. SOCIAL HISTORY Social History   Tobacco Use   Smoking status: Never    Passive exposure: Past   Smokeless tobacco: Never  Vaping Use   Vaping status: Never Used  Substance Use Topics   Alcohol use: Yes    Alcohol/week: 6.0 standard drinks of alcohol    Types: 6 Standard drinks or equivalent per week    Comment: occasional   Drug use: Yes    Types: Marijuana       OPHTHALMIC EXAM:  Not recorded    IMAGING AND PROCEDURES  Imaging and Procedures for 01/18/2024         ASSESSMENT/PLAN:   ICD-10-CM   1. Moderate nonproliferative diabetic retinopathy of both eyes with macular edema associated with type 2 diabetes mellitus (HCC)  J81.1914     2. Long-term (current) use of injectable non-insulin  antidiabetic drugs  Z79.85     3. Essential hypertension  I10     4. Hypertensive retinopathy of both eyes  H35.033       1,2. Moderate non-proliferative diabetic retinopathy, both eyes  - A1c: 12.5 on 12.05.25 - The incidence, risk  factors for progression, natural history and treatment options for diabetic retinopathy were discussed with patient.   - The need for close monitoring of blood glucose, blood pressure, and serum lipids, avoiding cigarette or any type of tobacco, and the need for long term follow up was also discussed with patient. - exam shows scattered MA OU, no NV OU - FA (02.04.25) shows late leaking MA's, no NV OU - OCT shows diabetic macular edema, both eyes  - The natural history, pathology, and characteristics of diabetic macular edema discussed with patient.  A generalized discussion of the major clinical trials concerning  treatment of diabetic macular edema (ETDRS, DCT, SCORE, RISE / RIDE, and ongoing DRCR net studies) was completed.  This discussion included mention of the various approaches to treating diabetic macular edema (observation, laser photocoagulation, anti-VEGF injections with lucentis / Avastin / Eylea, steroid injections with Kenalog / Ozurdex, and intraocular surgery with vitrectomy).  The goal hemoglobin A1C of 6-7 was discussed, as well as importance of smoking cessation and hypertension control.  Need for ongoing treatment and monitoring were specifically discussed with reference to chronic nature of diabetic macular edema. - BCVA OD 20/25; OS 20/20 - no treatment recommended today -- will monitor for now - f/u in 3-4 months -- DFE/OCT, possible injection  3,4. Hypertensive retinopathy OU - discussed importance of tight BP control - monitor   Ophthalmic Meds Ordered this visit:  No orders of the defined types were placed in this encounter.    No follow-ups on file.  There are no Patient Instructions on file for this visit.  Explained the diagnoses, plan, and follow up with the patient and they expressed understanding.  Patient expressed understanding of the importance of proper follow up care.   This document serves as a record of services personally performed by Jeanice Millard, MD, PhD. It was created on their behalf by Olene Berne, COT an ophthalmic technician. The creation of this record is the provider's dictation and/or activities during the visit.    Electronically signed by:  Olene Berne, COT  01/06/24 7:45 AM  Jeanice Millard, M.D., Ph.D. Diseases & Surgery of the Retina and Vitreous [facility] 01/18/2024    Abbreviations: M myopia (nearsighted); A astigmatism; H hyperopia (farsighted); P presbyopia; Mrx spectacle prescription;  CTL contact lenses; OD right eye; OS left eye; OU both eyes  XT exotropia; ET esotropia; PEK punctate  epithelial keratitis; PEE punctate epithelial erosions; DES dry eye syndrome; MGD meibomian gland dysfunction; ATs artificial tears; PFAT's preservative free artificial tears; NSC nuclear sclerotic cataract; PSC posterior subcapsular cataract; ERM epi-retinal membrane; PVD posterior vitreous detachment; RD retinal detachment; DM diabetes mellitus; DR diabetic retinopathy; NPDR non-proliferative diabetic retinopathy; PDR proliferative diabetic retinopathy; CSME clinically significant macular edema; DME diabetic macular edema; dbh dot blot hemorrhages; CWS cotton wool spot; POAG primary open angle glaucoma; C/D cup-to-disc ratio; HVF humphrey visual field; GVF goldmann visual field; OCT optical coherence tomography; IOP intraocular pressure; BRVO Branch retinal vein occlusion; CRVO central retinal vein occlusion; CRAO central retinal artery occlusion; BRAO branch retinal artery occlusion; RT retinal tear; SB scleral buckle; PPV pars plana vitrectomy; VH Vitreous hemorrhage; PRP panretinal laser photocoagulation; IVK intravitreal kenalog; VMT vitreomacular traction; MH Macular hole;  NVD neovascularization of the disc; NVE neovascularization elsewhere; AREDS age related eye disease study; ARMD age related macular degeneration; POAG primary open angle glaucoma; EBMD epithelial/anterior basement membrane dystrophy; ACIOL anterior chamber intraocular lens; IOL intraocular lens; PCIOL posterior chamber  intraocular lens; Phaco/IOL phacoemulsification with intraocular lens placement; PRK photorefractive keratectomy; LASIK laser assisted in situ keratomileusis; HTN hypertension; DM diabetes mellitus; COPD chronic obstructive pulmonary disease

## 2024-01-18 ENCOUNTER — Encounter (INDEPENDENT_AMBULATORY_CARE_PROVIDER_SITE_OTHER): Payer: 59 | Admitting: Ophthalmology

## 2024-01-18 DIAGNOSIS — E113313 Type 2 diabetes mellitus with moderate nonproliferative diabetic retinopathy with macular edema, bilateral: Secondary | ICD-10-CM

## 2024-01-18 DIAGNOSIS — H35033 Hypertensive retinopathy, bilateral: Secondary | ICD-10-CM

## 2024-01-18 DIAGNOSIS — Z7985 Long-term (current) use of injectable non-insulin antidiabetic drugs: Secondary | ICD-10-CM

## 2024-01-18 DIAGNOSIS — I10 Essential (primary) hypertension: Secondary | ICD-10-CM

## 2024-01-22 ENCOUNTER — Other Ambulatory Visit: Payer: Self-pay | Admitting: Internal Medicine

## 2024-01-22 DIAGNOSIS — E1169 Type 2 diabetes mellitus with other specified complication: Secondary | ICD-10-CM

## 2024-02-08 ENCOUNTER — Ambulatory Visit: Admitting: Endocrinology

## 2024-02-12 ENCOUNTER — Other Ambulatory Visit: Payer: Self-pay | Admitting: Internal Medicine

## 2024-02-12 DIAGNOSIS — I10 Essential (primary) hypertension: Secondary | ICD-10-CM

## 2024-02-22 NOTE — Progress Notes (Signed)
 [facility] - Clinic Note  02/29/2024   CHIEF COMPLAINT Patient presents for Retina Follow Up  HISTORY OF PRESENT ILLNESS: Peter Powell is a 38 y.o. male who presents to the clinic today for:  HPI     Retina Follow Up   Patient presents with  Diabetic Retinopathy.  In both eyes.  Severity is moderate.  Duration of 5 months.  Since onset it is stable.  I, the attending physician,  performed the HPI with the patient and updated documentation appropriately.        Comments   Pt here for 5 mo ret f/u NPDR OU. Pt states no VA changes. Does not wear rx specs. Pt has not had lab work done recently. Does not check blood sugar.       Last edited by Shawnee Gambone, MD on 03/02/2024  8:16 AM.    Pt states his vision is fuzzy, his A1c was 11.1 in April, he states he has a check up next month, but he is taking his medication    Referring physician: Frazier, [place], OD [address] [name],  [name] [address]  HISTORICAL INFORMATION:  Selected notes from the MEDICAL RECORD NUMBER Referred by Dr. [name] for concern of NPDR LEE:  Ocular Hx- PMH-   CURRENT MEDICATIONS: No current outpatient medications on file. (Ophthalmic Drugs)   No current facility-administered medications for this visit. (Ophthalmic Drugs)   Current Outpatient Medications (Other)  Medication Sig   amLODipine  (NORVASC ) 5 MG tablet TAKE 1 TABLET(5 MG) BY MOUTH DAILY   atorvastatin  (LIPITOR) 80 MG tablet Take 1 tablet (80 mg total) by mouth daily.   Blood Glucose Monitoring Suppl DEVI 1 each by Does not apply route in the morning, at noon, and at bedtime. May substitute to any manufacturer covered by patient's insurance.   ezetimibe  (ZETIA ) 10 MG tablet TAKE 1 TABLET(10 MG) BY MOUTH DAILY   glimepiride  (AMARYL ) 4 MG tablet Take 1 tablet (4 mg total) by mouth daily before breakfast.   Glucose Blood (BLOOD GLUCOSE TEST STRIPS) STRP 1 each by In Vitro route in the morning, at noon, and at  bedtime. May substitute to any manufacturer covered by patient's insurance.   tirzepatide  (MOUNJARO ) 10 MG/0.5ML Pen Inject 10 mg into the skin once a week.   valsartan -hydrochlorothiazide  (DIOVAN -HCT) 160-25 MG tablet TAKE 1 TABLET BY MOUTH EVERY DAY   No current facility-administered medications for this visit. (Other)   REVIEW OF SYSTEMS: ROS   Positive for: Endocrine, Eyes Negative for: Constitutional, Gastrointestinal, Neurological, Skin, Genitourinary, Musculoskeletal, HENT, Cardiovascular, Respiratory, Psychiatric, Allergic/Imm, Heme/Lymph Last edited by Antonetta Almetta BRAVO, COT on 02/29/2024  8:09 AM.      ALLERGIES No Known Allergies PAST MEDICAL HISTORY Past Medical History:  Diagnosis Date   DM (diabetes mellitus), type 2 (HCC)    Hypertension    Morbid obesity (HCC)    Past Surgical History:  Procedure Laterality Date   KNEE SURGERY Right 2001   screws placed below kneecap   FAMILY HISTORY History reviewed. No pertinent family history. SOCIAL HISTORY Social History   Tobacco Use   Smoking status: Never    Passive exposure: Past   Smokeless tobacco: Never  Vaping Use   Vaping status: Never Used  Substance Use Topics   Alcohol use: Yes    Alcohol/week: 6.0 standard drinks of alcohol    Types: 6 Standard drinks or equivalent per week    Comment: occasional   Drug use: Yes  Types: Marijuana       OPHTHALMIC EXAM:  Base Eye Exam     Visual Acuity (Snellen - Linear)       Right Left   Dist [place] 20/50 -1 20/30 -1   Dist ph  20/40 NI         Tonometry (Tonopen, 8:22 AM)       Right Left   Pressure 15 14         Pupils       Pupils Dark Light Shape React APD   Right PERRL 3 2 Round Brisk None   Left PERRL 3 2 Round Brisk None         Visual Fields (Counting fingers)       Left Right    Full Full         Extraocular Movement       Right Left    Full, Ortho Full, Ortho         Neuro/Psych     Oriented x3: Yes    Mood/Affect: Normal         Dilation     Both eyes: 1.0% Mydriacyl, 2.5% Phenylephrine @ 8:24 AM           Slit Lamp and Fundus Exam     Slit Lamp Exam       Right Left   Lids/Lashes Normal Normal   Conjunctiva/Sclera Mild pingeucula Mild pingeucula   Cornea Clear Clear   Anterior Chamber deep and clear deep and clear   Iris Round and dilated, No NVI Round and dilated, No NVI   Lens Clear Clear   Anterior Vitreous mild syneresis mild syneresis         Fundus Exam       Right Left   Disc Pink and Sharp, mild PPP Pink and Sharp, no NVD   C/D Ratio 0.4 0.4   Macula Flat, good foveal reflex, scattered MA/DBH and punctate exudates, interval increase in central edema Flat, good foveal reflex, scattered MA/DBH and punctate exudates, interval increase in central edema   Vessels attenuated, Tortuous attenuated, Tortuous   Periphery Attached, 360 MA/DBH and punctate exudates greatest posteriorly Attached, 360 MA/DBH and punctate exudates greatest posteriorly           Refraction     Manifest Refraction       Sphere Cylinder Axis Dist VA   Right -1.50 +0.50 079 20/25-1   Left -1.00 +0.25 019 20/20           IMAGING AND PROCEDURES  Imaging and Procedures for 02/29/2024  OCT, Retina - OU - Both Eyes       Right Eye Quality was good. Central Foveal Thickness: 367. Progression has worsened. Findings include no SRF, abnormal foveal contour, intraretinal hyper-reflective material, intraretinal fluid, vitreomacular adhesion (Interval increase in IRF / IRHM / DME).   Left Eye Quality was good. Central Foveal Thickness: 406. Progression has worsened. Findings include no SRF, abnormal foveal contour, intraretinal hyper-reflective material, intraretinal fluid, vitreomacular adhesion (Interval increase in scattered cystic changes greatest temporal macula and fovea).   Notes *Images captured and stored on drive  Diagnosis / Impression:  OD: Interval increase in IRF /  IRHM / DME OS: Interval increase in scattered cystic changes greatest temporal macula and fovea  Clinical management:  See below  Abbreviations: NFP - Normal foveal profile. CME - cystoid macular edema. PED - pigment epithelial detachment. IRF - intraretinal fluid. SRF - subretinal fluid. EZ - ellipsoid zone. ERM -  epiretinal membrane. ORA - outer retinal atrophy. ORT - outer retinal tubulation. SRHM - subretinal hyper-reflective material. IRHM - intraretinal hyper-reflective material      Intravitreal Injection, Pharmacologic Agent - OD - Right Eye       Time Out 02/29/2024. 9:14 AM. Confirmed correct patient, procedure, site, and patient consented.   Anesthesia Topical anesthesia was used. Anesthetic medications included Lidocaine 2%, Proparacaine 0.5%.   Procedure Preparation included 5% betadine to ocular surface, eyelid speculum. A supplied needle was used.   Injection: 1.25 mg Bevacizumab  1.25mg /0.84ml   Route: Intravitreal, Site: Right Eye   NDC: [id number], Lot: [id number], Expiration date: 03/13/2024   Post-op Post injection exam found visual acuity of at least counting fingers. The patient tolerated the procedure well. There were no complications. The patient received written and verbal post procedure care education.           ASSESSMENT/PLAN:   ICD-10-CM   1. Moderate nonproliferative diabetic retinopathy of both eyes with macular edema associated with type 2 diabetes mellitus (HCC)  E11.3313 OCT, Retina - OU - Both Eyes    Intravitreal Injection, Pharmacologic Agent - OD - Right Eye    Bevacizumab  (AVASTIN ) SOLN 1.25 mg    2. Long-term (current) use of injectable non-insulin  antidiabetic drugs  Z79.85     3. Essential hypertension  I10     4. Hypertensive retinopathy of both eyes  H35.033      1,2. Moderate non-proliferative diabetic retinopathy, both eyes  - A1c: 11.1 on 04.01.25 - exam shows scattered MA OU, no NV OU - FA (02.04.25) shows late leaking MA's,  no NV OU - OCT shows OD: Interval increase in IRF / IRHM / DME; OS: Interval increase in scattered cystic changes greatest temporal macula and fovea - BCVA OD decreased to 20/40 from 20/25; OS decreased to 20/30 from 20/20 - recommend IVA OU #1, but will start with OD first today, 07.15.25 - pt wishes to proceed with injections - RBA of procedure discussed, questions answered - IVA informed consent obtained and signed, 07.15.25 (OU) - see procedure note - f/u Thursday at 830 -- DFE/OCT, possible injection  3,4. Hypertensive retinopathy OU - discussed importance of tight BP control - monitor   Ophthalmic Meds Ordered this visit:  Meds ordered this encounter  Medications   Bevacizumab  (AVASTIN ) SOLN 1.25 mg     Return in about 2 days (around 03/02/2024) for f/u NPDR OU, OCT, Possible Injxn OS.  There are no Patient Instructions on file for this visit.  Explained the diagnoses, plan, and follow up with the patient and they expressed understanding.  Patient expressed understanding of the importance of proper follow up care.   This document serves as a record of services personally performed by Redell JUDITHANN Hans, MD, PhD. It was created on their behalf by Auston Muzzy, COMT. The creation of this record is the provider's dictation and/or activities during the visit.  Electronically signed by: Auston Muzzy, COMT 03/02/24 8:17 AM  This document serves as a record of services personally performed by Redell JUDITHANN Hans, MD, PhD. It was created on their behalf by Alan PARAS. Delores, OA an ophthalmic technician. The creation of this record is the provider's dictation and/or activities during the visit.    Electronically signed by: Alan PARAS. Delores, OA 03/02/24 8:17 AM  Redell JUDITHANN Hans, M.D., Ph.D. Diseases & Surgery of the Retina and Vitreous [facility]  I have reviewed the above documentation for accuracy and completeness, and  I agree with the above. Redell JUDITHANN Hans, M.D.,  Ph.D. 03/02/24 8:19 AM   Abbreviations: M myopia (nearsighted); A astigmatism; H hyperopia (farsighted); P presbyopia; Mrx spectacle prescription;  CTL contact lenses; OD right eye; OS left eye; OU both eyes  XT exotropia; ET esotropia; PEK punctate epithelial keratitis; PEE punctate epithelial erosions; DES dry eye syndrome; MGD meibomian gland dysfunction; ATs artificial tears; PFAT's preservative free artificial tears; NSC nuclear sclerotic cataract; PSC posterior subcapsular cataract; ERM epi-retinal membrane; PVD posterior vitreous detachment; RD retinal detachment; DM diabetes mellitus; DR diabetic retinopathy; NPDR non-proliferative diabetic retinopathy; PDR proliferative diabetic retinopathy; CSME clinically significant macular edema; DME diabetic macular edema; dbh dot blot hemorrhages; CWS cotton wool spot; POAG primary open angle glaucoma; C/D cup-to-disc ratio; HVF humphrey visual field; GVF goldmann visual field; OCT optical coherence tomography; IOP intraocular pressure; BRVO Branch retinal vein occlusion; CRVO central retinal vein occlusion; CRAO central retinal artery occlusion; BRAO branch retinal artery occlusion; RT retinal tear; SB scleral buckle; PPV pars plana vitrectomy; VH Vitreous hemorrhage; PRP panretinal laser photocoagulation; IVK intravitreal kenalog; VMT vitreomacular traction; MH Macular hole;  NVD neovascularization of the disc; NVE neovascularization elsewhere; AREDS age related eye disease study; ARMD age related macular degeneration; POAG primary open angle glaucoma; EBMD epithelial/anterior basement membrane dystrophy; ACIOL anterior chamber intraocular lens; IOL intraocular lens; PCIOL posterior chamber intraocular lens; Phaco/IOL phacoemulsification with intraocular lens placement; PRK photorefractive keratectomy; LASIK laser assisted in situ keratomileusis; HTN hypertension; DM diabetes mellitus; COPD chronic obstructive pulmonary disease

## 2024-02-29 ENCOUNTER — Ambulatory Visit (INDEPENDENT_AMBULATORY_CARE_PROVIDER_SITE_OTHER): Admitting: Ophthalmology

## 2024-02-29 ENCOUNTER — Encounter (INDEPENDENT_AMBULATORY_CARE_PROVIDER_SITE_OTHER): Payer: Self-pay | Admitting: Ophthalmology

## 2024-02-29 DIAGNOSIS — E113313 Type 2 diabetes mellitus with moderate nonproliferative diabetic retinopathy with macular edema, bilateral: Secondary | ICD-10-CM | POA: Diagnosis not present

## 2024-02-29 DIAGNOSIS — Z7985 Long-term (current) use of injectable non-insulin antidiabetic drugs: Secondary | ICD-10-CM

## 2024-02-29 DIAGNOSIS — H35033 Hypertensive retinopathy, bilateral: Secondary | ICD-10-CM | POA: Diagnosis not present

## 2024-02-29 DIAGNOSIS — I10 Essential (primary) hypertension: Secondary | ICD-10-CM | POA: Diagnosis not present

## 2024-02-29 MED ORDER — BEVACIZUMAB CHEMO INJECTION 1.25MG/0.05ML SYRINGE FOR KALEIDOSCOPE
1.2500 mg | INTRAVITREAL | Status: AC | PRN
  Administered 2024-02-29: 1.25 mg via INTRAVITREAL

## 2024-03-02 ENCOUNTER — Ambulatory Visit (INDEPENDENT_AMBULATORY_CARE_PROVIDER_SITE_OTHER): Admitting: Ophthalmology

## 2024-03-02 ENCOUNTER — Encounter (INDEPENDENT_AMBULATORY_CARE_PROVIDER_SITE_OTHER): Payer: Self-pay | Admitting: Ophthalmology

## 2024-03-02 DIAGNOSIS — H35033 Hypertensive retinopathy, bilateral: Secondary | ICD-10-CM

## 2024-03-02 DIAGNOSIS — E113313 Type 2 diabetes mellitus with moderate nonproliferative diabetic retinopathy with macular edema, bilateral: Secondary | ICD-10-CM

## 2024-03-02 DIAGNOSIS — I10 Essential (primary) hypertension: Secondary | ICD-10-CM | POA: Diagnosis not present

## 2024-03-02 DIAGNOSIS — Z7985 Long-term (current) use of injectable non-insulin antidiabetic drugs: Secondary | ICD-10-CM

## 2024-03-02 MED ORDER — BEVACIZUMAB CHEMO INJECTION 1.25MG/0.05ML SYRINGE FOR KALEIDOSCOPE
1.2500 mg | INTRAVITREAL | Status: AC | PRN
  Administered 2024-03-02: 1.25 mg via INTRAVITREAL

## 2024-03-02 NOTE — Progress Notes (Addendum)
 [facility] - Clinic Note  03/02/2024   CHIEF COMPLAINT Patient presents for Retina Follow Up  HISTORY OF PRESENT ILLNESS: Peter Powell is a 38 y.o. male who presents to the clinic today for:  HPI     Retina Follow Up   Patient presents with  Diabetic Retinopathy.  In both eyes.  Severity is moderate.  Duration of 5 months.  Since onset it is stable.  I, the attending physician,  performed the HPI with the patient and updated documentation appropriately.        Comments   Pt states no VA changes. Pt here for left eye treatment following Tuesday's appoinment. Pt denies Fol/floaters/pain.      Last edited by Valdemar Rogue, MD on 03/02/2024  3:28 PM.    Pt here for IVA OS    Referring physician: Frazier, [place], OD [address] [place] [name],  [name] [address]  HISTORICAL INFORMATION:  Selected notes from the MEDICAL RECORD NUMBER Referred by Dr. [name] for concern of NPDR LEE:  Ocular Hx- PMH-   CURRENT MEDICATIONS: No current outpatient medications on file. (Ophthalmic Drugs)   No current facility-administered medications for this visit. (Ophthalmic Drugs)   Current Outpatient Medications (Other)  Medication Sig   amLODipine  (NORVASC ) 5 MG tablet TAKE 1 TABLET(5 MG) BY MOUTH DAILY   atorvastatin  (LIPITOR) 80 MG tablet Take 1 tablet (80 mg total) by mouth daily.   Blood Glucose Monitoring Suppl DEVI 1 each by Does not apply route in the morning, at noon, and at bedtime. May substitute to any manufacturer covered by patient's insurance.   ezetimibe  (ZETIA ) 10 MG tablet TAKE 1 TABLET(10 MG) BY MOUTH DAILY   glimepiride  (AMARYL ) 4 MG tablet Take 1 tablet (4 mg total) by mouth daily before breakfast.   Glucose Blood (BLOOD GLUCOSE TEST STRIPS) STRP 1 each by In Vitro route in the morning, at noon, and at bedtime. May substitute to any manufacturer covered by patient's insurance.   tirzepatide  (MOUNJARO ) 10 MG/0.5ML Pen Inject 10 mg into the skin once  a week.   valsartan -hydrochlorothiazide  (DIOVAN -HCT) 160-25 MG tablet TAKE 1 TABLET BY MOUTH EVERY DAY   No current facility-administered medications for this visit. (Other)   REVIEW OF SYSTEMS: ROS   Positive for: Endocrine, Eyes Negative for: Constitutional, Gastrointestinal, Neurological, Skin, Genitourinary, Musculoskeletal, HENT, Cardiovascular, Respiratory, Psychiatric, Allergic/Imm, Heme/Lymph Last edited by Elnor Avelina RAMAN, COT on 03/02/2024  8:55 AM.      ALLERGIES No Known Allergies PAST MEDICAL HISTORY Past Medical History:  Diagnosis Date   DM (diabetes mellitus), type 2 (HCC)    Hypertension    Morbid obesity (HCC)    Past Surgical History:  Procedure Laterality Date   KNEE SURGERY Right 2001   screws placed below kneecap   FAMILY HISTORY History reviewed. No pertinent family history. SOCIAL HISTORY Social History   Tobacco Use   Smoking status: Never    Passive exposure: Past   Smokeless tobacco: Never  Vaping Use   Vaping status: Never Used  Substance Use Topics   Alcohol use: Yes    Alcohol/week: 6.0 standard drinks of alcohol    Types: 6 Standard drinks or equivalent per week    Comment: occasional   Drug use: Yes    Types: Marijuana       OPHTHALMIC EXAM:  Base Eye Exam     Visual Acuity (Snellen - Linear)       Right Left   Dist [place] 20/50 -2  20/25 +2   Dist ph [place] 20/25 -1 NI         Tonometry (Tonopen, 9:01 AM)       Right Left   Pressure 22 18         Neuro/Psych     Oriented x3: Yes   Mood/Affect: Normal           Slit Lamp and Fundus Exam     Slit Lamp Exam       Right Left   Lids/Lashes Normal Normal   Conjunctiva/Sclera Mild pingeucula Mild pingeucula   Cornea Clear Clear   Anterior Chamber deep and clear deep and clear   Iris Round and dilated, No NVI Round and dilated, No NVI   Lens Clear Clear   Anterior Vitreous mild syneresis mild syneresis         Fundus Exam       Right Left   Disc Pink and  Sharp, mild PPP Pink and Sharp, no NVD   C/D Ratio 0.4 0.4   Macula Flat, good foveal reflex, scattered MA/DBH and punctate exudates, interval increase in central edema Flat, good foveal reflex, scattered MA/DBH and punctate exudates, interval increase in central edema   Vessels attenuated, Tortuous attenuated, Tortuous   Periphery Attached, 360 MA/DBH and punctate exudates greatest posteriorly Attached, 360 MA/DBH and punctate exudates greatest posteriorly           IMAGING AND PROCEDURES  Imaging and Procedures for 03/02/2024  OCT, Retina - OU - Both Eyes       Right Eye Quality was good. Central Foveal Thickness: 357. Progression has been stable. Findings include no SRF, abnormal foveal contour, intraretinal hyper-reflective material, intraretinal fluid, vitreomacular adhesion (Persistent IRF / IRHM / DME).   Left Eye Quality was good. Central Foveal Thickness: 422. Progression has been stable. Findings include no SRF, abnormal foveal contour, intraretinal hyper-reflective material, intraretinal fluid, vitreomacular adhesion (Persistent scattered cystic changes greatest temporal macula and fovea).   Notes *Images captured and stored on drive  Diagnosis / Impression:  OD: persistent IRF / IRHM / DME OS: persistent scattered cystic changes greatest temporal macula and fovea  Clinical management:  See below  Abbreviations: NFP - Normal foveal profile. CME - cystoid macular edema. PED - pigment epithelial detachment. IRF - intraretinal fluid. SRF - subretinal fluid. EZ - ellipsoid zone. ERM - epiretinal membrane. ORA - outer retinal atrophy. ORT - outer retinal tubulation. SRHM - subretinal hyper-reflective material. IRHM - intraretinal hyper-reflective material      Intravitreal Injection, Pharmacologic Agent - OS - Left Eye       Time Out 03/02/2024. 9:25 AM. Confirmed correct patient, procedure, site, and patient consented.   Anesthesia Topical anesthesia was used.  Anesthetic medications included Lidocaine 2%, Proparacaine 0.5%.   Procedure Preparation included 5% betadine to ocular surface, eyelid speculum. A supplied needle was used.   Injection: 1.25 mg Bevacizumab  1.25mg /0.39ml   Route: Intravitreal, Site: Left Eye   NDC: 49757-939-98, Lot: [id number], Expiration date: 03/13/2024   Post-op Post injection exam found visual acuity of at least counting fingers. The patient tolerated the procedure well. There were no complications. The patient received written and verbal post procedure care education.            ASSESSMENT/PLAN:   ICD-10-CM   1. Moderate nonproliferative diabetic retinopathy of both eyes with macular edema associated with type 2 diabetes mellitus (HCC)  E11.3313 OCT, Retina - OU - Both Eyes    Intravitreal Injection, Pharmacologic Agent -  OS - Left Eye    Bevacizumab  (AVASTIN ) SOLN 1.25 mg    2. Long-term (current) use of injectable non-insulin  antidiabetic drugs  Z79.85     3. Essential hypertension  I10     4. Hypertensive retinopathy of both eyes  H35.033      1,2. Moderate non-proliferative diabetic retinopathy, both eyes  - s/p IVA OD #1 (07.15.25)  - A1c: 11.1 on 04.01.25 - exam shows scattered MA OU, no NV OU - FA (02.04.25) shows late leaking MA's, no NV OU - OCT shows OD: persistent IRF / IRHM / DME; OS: persistent scattered cystic changes greatest temporal macula and fovea - BCVA 20/25 OU - recommend IVA OS #1 today, 07.17.25 - pt wishes to proceed with injections - RBA of procedure discussed, questions answered - IVA informed consent obtained and signed, 07.15.25 (OU) - see procedure note - f/u 4 weeks -- DFE/OCT, possible injections  3,4. Hypertensive retinopathy OU - discussed importance of tight BP control - monitor   Ophthalmic Meds Ordered this visit:  Meds ordered this encounter  Medications   Bevacizumab  (AVASTIN ) SOLN 1.25 mg     Return in about 4 weeks (around 03/30/2024) for f/u NPDR OU, DFE,  OCT, Possible Injxn.  There are no Patient Instructions on file for this visit.  Explained the diagnoses, plan, and follow up with the patient and they expressed understanding.  Patient expressed understanding of the importance of proper follow up care.   This document serves as a record of services personally performed by Redell JUDITHANN Hans, MD, PhD. It was created on their behalf by Alan PARAS. Delores, OA an ophthalmic technician. The creation of this record is the provider's dictation and/or activities during the visit.    Electronically signed by: Alan PARAS. Delores, OA 03/02/24 3:34 PM  Redell JUDITHANN Hans, M.D., Ph.D. Diseases & Surgery of the Retina and Vitreous [facility]  I have reviewed the above documentation for accuracy and completeness, and I agree with the above. Redell JUDITHANN Hans, M.D., Ph.D. 03/02/24 3:34 PM    Abbreviations: M myopia (nearsighted); A astigmatism; H hyperopia (farsighted); P presbyopia; Mrx spectacle prescription;  CTL contact lenses; OD right eye; OS left eye; OU both eyes  XT exotropia; ET esotropia; PEK punctate epithelial keratitis; PEE punctate epithelial erosions; DES dry eye syndrome; MGD meibomian gland dysfunction; ATs artificial tears; PFAT's preservative free artificial tears; NSC nuclear sclerotic cataract; PSC posterior subcapsular cataract; ERM epi-retinal membrane; PVD posterior vitreous detachment; RD retinal detachment; DM diabetes mellitus; DR diabetic retinopathy; NPDR non-proliferative diabetic retinopathy; PDR proliferative diabetic retinopathy; CSME clinically significant macular edema; DME diabetic macular edema; dbh dot blot hemorrhages; CWS cotton wool spot; POAG primary open angle glaucoma; C/D cup-to-disc ratio; HVF humphrey visual field; GVF goldmann visual field; OCT optical coherence tomography; IOP intraocular pressure; BRVO Branch retinal vein occlusion; CRVO central retinal vein occlusion; CRAO central retinal artery  occlusion; BRAO branch retinal artery occlusion; RT retinal tear; SB scleral buckle; PPV pars plana vitrectomy; VH Vitreous hemorrhage; PRP panretinal laser photocoagulation; IVK intravitreal kenalog; VMT vitreomacular traction; MH Macular hole;  NVD neovascularization of the disc; NVE neovascularization elsewhere; AREDS age related eye disease study; ARMD age related macular degeneration; POAG primary open angle glaucoma; EBMD epithelial/anterior basement membrane dystrophy; ACIOL anterior chamber intraocular lens; IOL intraocular lens; PCIOL posterior chamber intraocular lens; Phaco/IOL phacoemulsification with intraocular lens placement; PRK photorefractive keratectomy; LASIK laser assisted in situ keratomileusis; HTN hypertension; DM diabetes mellitus; COPD chronic obstructive pulmonary disease

## 2024-03-16 NOTE — Progress Notes (Shared)
 [facility] - Clinic Note  03/30/2024   CHIEF COMPLAINT Patient presents for No chief complaint on file.  HISTORY OF PRESENT ILLNESS: Peter Powell is a 38 y.o. male who presents to the clinic today for:   Pt here for IVA OS    Referring physician: Frazier, [place], OD [address] [name],  [name] [address]  HISTORICAL INFORMATION:  Selected notes from the MEDICAL RECORD NUMBER Referred by Dr. [name] for concern of NPDR LEE:  Ocular Hx- PMH-   CURRENT MEDICATIONS: No current outpatient medications on file. (Ophthalmic Drugs)   No current facility-administered medications for this visit. (Ophthalmic Drugs)   Current Outpatient Medications (Other)  Medication Sig   amLODipine  (NORVASC ) 5 MG tablet TAKE 1 TABLET(5 MG) BY MOUTH DAILY   atorvastatin  (LIPITOR) 80 MG tablet Take 1 tablet (80 mg total) by mouth daily.   Blood Glucose Monitoring Suppl DEVI 1 each by Does not apply route in the morning, at noon, and at bedtime. May substitute to any manufacturer covered by patient's insurance.   ezetimibe  (ZETIA ) 10 MG tablet TAKE 1 TABLET(10 MG) BY MOUTH DAILY   glimepiride  (AMARYL ) 4 MG tablet Take 1 tablet (4 mg total) by mouth daily before breakfast.   Glucose Blood (BLOOD GLUCOSE TEST STRIPS) STRP 1 each by In Vitro route in the morning, at noon, and at bedtime. May substitute to any manufacturer covered by patient's insurance.   tirzepatide  (MOUNJARO ) 10 MG/0.5ML Pen Inject 10 mg into the skin once a week.   valsartan -hydrochlorothiazide  (DIOVAN -HCT) 160-25 MG tablet TAKE 1 TABLET BY MOUTH EVERY DAY   No current facility-administered medications for this visit. (Other)   REVIEW OF SYSTEMS:    ALLERGIES No Known Allergies PAST MEDICAL HISTORY Past Medical History:  Diagnosis Date   DM (diabetes mellitus), type 2 (HCC)    Hypertension    Morbid obesity (HCC)    Past Surgical History:  Procedure Laterality Date   KNEE SURGERY Right 2001    screws placed below kneecap   FAMILY HISTORY No family history on file. SOCIAL HISTORY Social History   Tobacco Use   Smoking status: Never    Passive exposure: Past   Smokeless tobacco: Never  Vaping Use   Vaping status: Never Used  Substance Use Topics   Alcohol use: Yes    Alcohol/week: 6.0 standard drinks of alcohol    Types: 6 Standard drinks or equivalent per week    Comment: occasional   Drug use: Yes    Types: Marijuana       OPHTHALMIC EXAM:  Not recorded    IMAGING AND PROCEDURES  Imaging and Procedures for 03/30/2024          ASSESSMENT/PLAN:   ICD-10-CM   1. Moderate nonproliferative diabetic retinopathy of both eyes with macular edema associated with type 2 diabetes mellitus (HCC)  Z88.6686     2. Long-term (current) use of injectable non-insulin  antidiabetic drugs  Z79.85     3. Essential hypertension  I10     4. Hypertensive retinopathy of both eyes  H35.033       1,2. Moderate non-proliferative diabetic retinopathy, both eyes  - s/p IVA OD #1 (07.15.25)  - s/p IVA OS #1 (07.17.25)   - A1c: 11.1 on 04.01.25 - exam shows scattered MA OU, no NV OU - FA (02.04.25) shows late leaking MA's, no NV OU - OCT shows OD: persistent IRF / IRHM / DME; OS: persistent scattered cystic changes greatest temporal macula  and fovea - BCVA 20/25 OU - recommend IVA OU #2 (08.14.25) , today - pt wishes to proceed with injections - RBA of procedure discussed, questions answered - IVA informed consent obtained and signed, 07.15.25 (OU) - see procedure note - f/u 4 weeks -- DFE/OCT, possible injection(s)  3,4. Hypertensive retinopathy OU - discussed importance of tight BP control - monitor   Ophthalmic Meds Ordered this visit:  No orders of the defined types were placed in this encounter.    No follow-ups on file.  There are no Patient Instructions on file for this visit.  Explained the diagnoses, plan, and follow up with the patient and they expressed  understanding.  Patient expressed understanding of the importance of proper follow up care.   This document serves as a record of services personally performed by Redell JUDITHANN Hans, MD, PhD. It was created on their behalf by Avelina Pereyra, COA an ophthalmic technician. The creation of this record is the provider's dictation and/or activities during the visit.   Electronically signed by: Avelina GORMAN Pereyra, COT  03/16/24  8:04 AM    Redell JUDITHANN Hans, M.D., Ph.D. Diseases & Surgery of the Retina and Vitreous [facility]    Abbreviations: M myopia (nearsighted); A astigmatism; H hyperopia (farsighted); P presbyopia; Mrx spectacle prescription;  CTL contact lenses; OD right eye; OS left eye; OU both eyes  XT exotropia; ET esotropia; PEK punctate epithelial keratitis; PEE punctate epithelial erosions; DES dry eye syndrome; MGD meibomian gland dysfunction; ATs artificial tears; PFAT's preservative free artificial tears; NSC nuclear sclerotic cataract; PSC posterior subcapsular cataract; ERM epi-retinal membrane; PVD posterior vitreous detachment; RD retinal detachment; DM diabetes mellitus; DR diabetic retinopathy; NPDR non-proliferative diabetic retinopathy; PDR proliferative diabetic retinopathy; CSME clinically significant macular edema; DME diabetic macular edema; dbh dot blot hemorrhages; CWS cotton wool spot; POAG primary open angle glaucoma; C/D cup-to-disc ratio; HVF humphrey visual field; GVF goldmann visual field; OCT optical coherence tomography; IOP intraocular pressure; BRVO Branch retinal vein occlusion; CRVO central retinal vein occlusion; CRAO central retinal artery occlusion; BRAO branch retinal artery occlusion; RT retinal tear; SB scleral buckle; PPV pars plana vitrectomy; VH Vitreous hemorrhage; PRP panretinal laser photocoagulation; IVK intravitreal kenalog; VMT vitreomacular traction; MH Macular hole;  NVD neovascularization of the disc; NVE neovascularization  elsewhere; AREDS age related eye disease study; ARMD age related macular degeneration; POAG primary open angle glaucoma; EBMD epithelial/anterior basement membrane dystrophy; ACIOL anterior chamber intraocular lens; IOL intraocular lens; PCIOL posterior chamber intraocular lens; Phaco/IOL phacoemulsification with intraocular lens placement; PRK photorefractive keratectomy; LASIK laser assisted in situ keratomileusis; HTN hypertension; DM diabetes mellitus; COPD chronic obstructive pulmonary disease

## 2024-03-30 ENCOUNTER — Encounter (INDEPENDENT_AMBULATORY_CARE_PROVIDER_SITE_OTHER): Admitting: Ophthalmology

## 2024-03-30 DIAGNOSIS — I10 Essential (primary) hypertension: Secondary | ICD-10-CM

## 2024-03-30 DIAGNOSIS — Z7985 Long-term (current) use of injectable non-insulin antidiabetic drugs: Secondary | ICD-10-CM

## 2024-03-30 DIAGNOSIS — H35033 Hypertensive retinopathy, bilateral: Secondary | ICD-10-CM

## 2024-03-30 DIAGNOSIS — E113313 Type 2 diabetes mellitus with moderate nonproliferative diabetic retinopathy with macular edema, bilateral: Secondary | ICD-10-CM

## 2024-04-03 NOTE — Progress Notes (Signed)
 [facility] - Clinic Note  04/04/2024   CHIEF COMPLAINT Patient presents for Retina Follow Up  HISTORY OF PRESENT ILLNESS: Peter Powell is a 38 y.o. male who presents to the clinic today for:  HPI     Retina Follow Up   Patient presents with  Diabetic Retinopathy.  In both eyes.  This started 4 weeks ago.  I, the attending physician,  performed the HPI with the patient and updated documentation appropriately.        Comments   Patient here for 4 weeks retina follow up for NPDR OU. Patient states vision doing good. No eye pain.       Last edited by Valdemar Rogue, MD on 04/04/2024  8:53 PM.     Pt states he can tell VA has improved OU. No issues after injections.   Referring physician: Frazier, [place], OD [address] [place] [name],  [name] [address]  HISTORICAL INFORMATION:  Selected notes from the MEDICAL RECORD NUMBER Referred by Dr. [name] for concern of NPDR LEE:  Ocular Hx- PMH-   CURRENT MEDICATIONS: No current outpatient medications on file. (Ophthalmic Drugs)   No current facility-administered medications for this visit. (Ophthalmic Drugs)   Current Outpatient Medications (Other)  Medication Sig   amLODipine  (NORVASC ) 5 MG tablet TAKE 1 TABLET(5 MG) BY MOUTH DAILY   atorvastatin  (LIPITOR) 80 MG tablet Take 1 tablet (80 mg total) by mouth daily.   Blood Glucose Monitoring Suppl DEVI 1 each by Does not apply route in the morning, at noon, and at bedtime. May substitute to any manufacturer covered by patient's insurance.   ezetimibe  (ZETIA ) 10 MG tablet TAKE 1 TABLET(10 MG) BY MOUTH DAILY   glimepiride  (AMARYL ) 4 MG tablet Take 1 tablet (4 mg total) by mouth daily before breakfast.   tirzepatide  (MOUNJARO ) 10 MG/0.5ML Pen Inject 10 mg into the skin once a week.   valsartan -hydrochlorothiazide  (DIOVAN -HCT) 160-25 MG tablet TAKE 1 TABLET BY MOUTH EVERY DAY   No current facility-administered medications for this visit. (Other)   REVIEW OF  SYSTEMS: ROS   Positive for: Endocrine, Eyes Negative for: Constitutional, Gastrointestinal, Neurological, Skin, Genitourinary, Musculoskeletal, HENT, Cardiovascular, Respiratory, Psychiatric, Allergic/Imm, Heme/Lymph Last edited by Orval Asberry RAMAN, COA on 04/04/2024  8:51 AM.       ALLERGIES No Known Allergies PAST MEDICAL HISTORY Past Medical History:  Diagnosis Date   DM (diabetes mellitus), type 2 (HCC)    Hypertension    Morbid obesity (HCC)    Past Surgical History:  Procedure Laterality Date   KNEE SURGERY Right 2001   screws placed below kneecap   FAMILY HISTORY History reviewed. No pertinent family history. SOCIAL HISTORY Social History   Tobacco Use   Smoking status: Never    Passive exposure: Past   Smokeless tobacco: Never  Vaping Use   Vaping status: Never Used  Substance Use Topics   Alcohol use: Yes    Alcohol/week: 6.0 standard drinks of alcohol    Types: 6 Standard drinks or equivalent per week    Comment: occasional   Drug use: Yes    Types: Marijuana       OPHTHALMIC EXAM:  Base Eye Exam     Visual Acuity (Snellen - Linear)       Right Left   Dist [place] 20/20 -1 20/20         Tonometry (Tonopen, 8:49 AM)       Right Left   Pressure 18 17  Pupils       Dark Light Shape React APD   Right 3 2 Round Brisk None   Left 3 2 Round Brisk None         Visual Fields (Counting fingers)       Left Right    Full Full         Extraocular Movement       Right Left    Full, Ortho Full, Ortho         Neuro/Psych     Oriented x3: Yes   Mood/Affect: Normal         Dilation     Both eyes: 1.0% Mydriacyl, 2.5% Phenylephrine @ 8:49 AM           Slit Lamp and Fundus Exam     Slit Lamp Exam       Right Left   Lids/Lashes Normal Normal   Conjunctiva/Sclera Mild pingeucula Mild pingeucula   Cornea Clear Clear   Anterior Chamber deep and clear deep and clear   Iris Round and dilated, No NVI Round and  dilated, No NVI   Lens Clear Clear   Anterior Vitreous mild syneresis mild syneresis         Fundus Exam       Right Left   Disc Pink and Sharp, mild PPP Pink and Sharp, no NVD   C/D Ratio 0.4 0.4   Macula Flat, good foveal reflex, scattered MA/DBH and punctate exudates, interval improvement in central edema Flat, good foveal reflex, scattered MA/DBH and punctate exudates, interval improvement in central edema   Vessels attenuated, Tortuous attenuated, Tortuous   Periphery Attached, 360 MA/DBH and punctate exudates greatest posteriorly Attached, 360 MA/DBH and punctate exudates greatest posteriorly           IMAGING AND PROCEDURES  Imaging and Procedures for 04/04/2024  OCT, Retina - OU - Both Eyes       Right Eye Quality was good. Central Foveal Thickness: 314. Progression has improved. Findings include no SRF, abnormal foveal contour, intraretinal hyper-reflective material, intraretinal fluid, vitreomacular adhesion (Interval improvement in IRF, edema and foveal contour).   Left Eye Quality was good. Central Foveal Thickness: 367. Progression has improved. Findings include no SRF, abnormal foveal contour, intraretinal hyper-reflective material, intraretinal fluid, vitreomacular adhesion (Interval improvement in IRF/edema superior macula and fovea).   Notes *Images captured and stored on drive  Diagnosis / Impression:  OD: Interval improvement in IRF, edema and foveal contour OS: Interval improvement in IRF/edema superior macula and fovea  Clinical management:  See below  Abbreviations: NFP - Normal foveal profile. CME - cystoid macular edema. PED - pigment epithelial detachment. IRF - intraretinal fluid. SRF - subretinal fluid. EZ - ellipsoid zone. ERM - epiretinal membrane. ORA - outer retinal atrophy. ORT - outer retinal tubulation. SRHM - subretinal hyper-reflective material. IRHM - intraretinal hyper-reflective material      Intravitreal Injection, Pharmacologic  Agent - OD - Right Eye       Time Out 04/04/2024. 9:22 AM. Confirmed correct patient, procedure, site, and patient consented.   Anesthesia Topical anesthesia was used. Anesthetic medications included Lidocaine 2%, Proparacaine 0.5%.   Procedure Preparation included 5% betadine to ocular surface, eyelid speculum. A (32g) needle was used.   Injection: 1.25 mg Bevacizumab  1.25mg /0.34ml   Route: Intravitreal, Site: Right Eye   NDC: H525437, Lot: [id number], Expiration date: 02/14/2024   Post-op Post injection exam found visual acuity of at least counting fingers. The patient tolerated the procedure well.  There were no complications. The patient received written and verbal post procedure care education.      Intravitreal Injection, Pharmacologic Agent - OS - Left Eye       Time Out 04/04/2024. 9:22 AM. Confirmed correct patient, procedure, site, and patient consented.   Anesthesia Topical anesthesia was used. Anesthetic medications included Lidocaine 2%, Proparacaine 0.5%.   Procedure Preparation included 5% betadine to ocular surface, eyelid speculum. A (32g) needle was used.   Injection: 1.25 mg Bevacizumab  1.25mg /0.67ml   Route: Intravitreal, Site: Left Eye   NDC: C2662926, Lot: [id number], Expiration date: 07/13/2024   Post-op Post injection exam found visual acuity of at least counting fingers. The patient tolerated the procedure well. There were no complications. The patient received written and verbal post procedure care education.           ASSESSMENT/PLAN:   ICD-10-CM   1. Moderate nonproliferative diabetic retinopathy of both eyes with macular edema associated with type 2 diabetes mellitus (HCC)  E11.3313 OCT, Retina - OU - Both Eyes    Intravitreal Injection, Pharmacologic Agent - OD - Right Eye    Intravitreal Injection, Pharmacologic Agent - OS - Left Eye    Bevacizumab  (AVASTIN ) SOLN 1.25 mg    Bevacizumab  (AVASTIN ) SOLN 1.25 mg    2. Long-term  (current) use of injectable non-insulin  antidiabetic drugs  Z79.85     3. Essential hypertension  I10     4. Hypertensive retinopathy of both eyes  H35.033      1,2. Moderate non-proliferative diabetic retinopathy, both eyes  - s/p IVA OD #1 (07.15.25)  - s/p IVA OS #1 (07.17.25)  - A1c: 11.1 on 04.01.25 - exam shows scattered MA OU, no NV OU - FA (02.04.25) shows late leaking MA's, no NV OU - OCT shows OD: Interval improvement in IRF, edema and foveal contour; OS: Interval improvement in IRF/edema superior macula and fovea - BCVA 20/20 improved from 20/25 OU - recommend IVA OU #2 today, 08.19.25 - pt wishes to proceed with injections - RBA of procedure discussed, questions answered - IVA informed consent obtained and signed, 07.15.25 (OU) - see procedure note - f/u 4 weeks -- DFE/OCT, possible injections  3,4. Hypertensive retinopathy OU - discussed importance of tight BP control - monitor  Ophthalmic Meds Ordered this visit:  Meds ordered this encounter  Medications   Bevacizumab  (AVASTIN ) SOLN 1.25 mg   Bevacizumab  (AVASTIN ) SOLN 1.25 mg     Return in about 4 weeks (around 05/02/2024) for NPDR OU, DFE, OCT, Possible Injxn.  There are no Patient Instructions on file for this visit.  Explained the diagnoses, plan, and follow up with the patient and they expressed understanding.  Patient expressed understanding of the importance of proper follow up care.   This document serves as a record of services personally performed by Redell JUDITHANN Hans, MD, PhD. It was created on their behalf by Auston Muzzy, COMT. The creation of this record is the provider's dictation and/or activities during the visit.  Electronically signed by: Auston Muzzy, COMT 04/04/24 9:03 PM  Redell JUDITHANN Hans, M.D., Ph.D. Diseases & Surgery of the Retina and Vitreous [facility]  I have reviewed the above documentation for accuracy and completeness, and I agree with the above. Redell JUDITHANN Hans, M.D., Ph.D. 04/04/24 9:03 PM   Abbreviations: M myopia (nearsighted); A astigmatism; H hyperopia (farsighted); P presbyopia; Mrx spectacle prescription;  CTL contact lenses; OD right eye; OS left eye; OU both eyes  XT exotropia; ET esotropia; PEK punctate epithelial keratitis; PEE punctate epithelial erosions; DES dry eye syndrome; MGD meibomian gland dysfunction; ATs artificial tears; PFAT's preservative free artificial tears; NSC nuclear sclerotic cataract; PSC posterior subcapsular cataract; ERM epi-retinal membrane; PVD posterior vitreous detachment; RD retinal detachment; DM diabetes mellitus; DR diabetic retinopathy; NPDR non-proliferative diabetic retinopathy; PDR proliferative diabetic retinopathy; CSME clinically significant macular edema; DME diabetic macular edema; dbh dot blot hemorrhages; CWS cotton wool spot; POAG primary open angle glaucoma; C/D cup-to-disc ratio; HVF humphrey visual field; GVF goldmann visual field; OCT optical coherence tomography; IOP intraocular pressure; BRVO Branch retinal vein occlusion; CRVO central retinal vein occlusion; CRAO central retinal artery occlusion; BRAO branch retinal artery occlusion; RT retinal tear; SB scleral buckle; PPV pars plana vitrectomy; VH Vitreous hemorrhage; PRP panretinal laser photocoagulation; IVK intravitreal kenalog; VMT vitreomacular traction; MH Macular hole;  NVD neovascularization of the disc; NVE neovascularization elsewhere; AREDS age related eye disease study; ARMD age related macular degeneration; POAG primary open angle glaucoma; EBMD epithelial/anterior basement membrane dystrophy; ACIOL anterior chamber intraocular lens; IOL intraocular lens; PCIOL posterior chamber intraocular lens; Phaco/IOL phacoemulsification with intraocular lens placement; PRK photorefractive keratectomy; LASIK laser assisted in situ keratomileusis; HTN hypertension; DM diabetes mellitus; COPD chronic obstructive pulmonary disease

## 2024-04-04 ENCOUNTER — Ambulatory Visit (INDEPENDENT_AMBULATORY_CARE_PROVIDER_SITE_OTHER): Admitting: Ophthalmology

## 2024-04-04 ENCOUNTER — Encounter (INDEPENDENT_AMBULATORY_CARE_PROVIDER_SITE_OTHER): Payer: Self-pay | Admitting: Ophthalmology

## 2024-04-04 DIAGNOSIS — I10 Essential (primary) hypertension: Secondary | ICD-10-CM | POA: Diagnosis not present

## 2024-04-04 DIAGNOSIS — Z7985 Long-term (current) use of injectable non-insulin antidiabetic drugs: Secondary | ICD-10-CM

## 2024-04-04 DIAGNOSIS — H35033 Hypertensive retinopathy, bilateral: Secondary | ICD-10-CM | POA: Diagnosis not present

## 2024-04-04 DIAGNOSIS — E113313 Type 2 diabetes mellitus with moderate nonproliferative diabetic retinopathy with macular edema, bilateral: Secondary | ICD-10-CM | POA: Diagnosis not present

## 2024-04-04 MED ORDER — BEVACIZUMAB CHEMO INJECTION 1.25MG/0.05ML SYRINGE FOR KALEIDOSCOPE
1.2500 mg | INTRAVITREAL | Status: AC | PRN
  Administered 2024-04-04: 1.25 mg via INTRAVITREAL

## 2024-04-04 MED ORDER — BEVACIZUMAB CHEMO INJECTION 1.25MG/0.05ML SYRINGE FOR KALEIDOSCOPE
1.2500 mg | INTRAVITREAL | Status: AC | PRN
Start: 2024-04-04 — End: 2024-04-04
  Administered 2024-04-04: 1.25 mg via INTRAVITREAL

## 2024-04-18 NOTE — Progress Notes (Signed)
 [facility] - Clinic Note  05/02/2024   CHIEF COMPLAINT Patient presents for Retina Follow Up  HISTORY OF PRESENT ILLNESS: Peter Powell is a 38 y.o. male who presents to the clinic today for:  HPI     Retina Follow Up   Patient presents with  Diabetic Retinopathy.  In both eyes.  This started 4 weeks ago.  Duration of 4 weeks.  Since onset it is stable.  I, the attending physician,  performed the HPI with the patient and updated documentation appropriately.        Comments   4 week retina follow up NPDR OU and IVA OU pt Is reporting no vision changes noticed he denies any flashes or floaters       Last edited by Valdemar Rogue, MD on 05/10/2024  6:23 PM.    Pt states   Referring physician: Frazier, [place], OD [address] [name],  [name] [address]  HISTORICAL INFORMATION:  Selected notes from the MEDICAL RECORD NUMBER Referred by Dr. [name] for concern of NPDR LEE:  Ocular Hx- PMH-   CURRENT MEDICATIONS: No current outpatient medications on file. (Ophthalmic Drugs)   No current facility-administered medications for this visit. (Ophthalmic Drugs)   Current Outpatient Medications (Other)  Medication Sig   amLODipine  (NORVASC ) 5 MG tablet TAKE 1 TABLET(5 MG) BY MOUTH DAILY   atorvastatin  (LIPITOR) 80 MG tablet Take 1 tablet (80 mg total) by mouth daily.   Blood Glucose Monitoring Suppl DEVI 1 each by Does not apply route in the morning, at noon, and at bedtime. May substitute to any manufacturer covered by patient's insurance.   ezetimibe  (ZETIA ) 10 MG tablet TAKE 1 TABLET(10 MG) BY MOUTH DAILY   glimepiride  (AMARYL ) 4 MG tablet Take 1 tablet (4 mg total) by mouth daily before breakfast.   tirzepatide  (MOUNJARO ) 10 MG/0.5ML Pen Inject 10 mg into the skin once a week.   valsartan -hydrochlorothiazide  (DIOVAN -HCT) 160-25 MG tablet TAKE 1 TABLET BY MOUTH EVERY DAY   No current facility-administered medications for this visit. (Other)   REVIEW  OF SYSTEMS: ROS   Positive for: Endocrine, Eyes Negative for: Constitutional, Gastrointestinal, Neurological, Skin, Genitourinary, Musculoskeletal, HENT, Cardiovascular, Respiratory, Psychiatric, Allergic/Imm, Heme/Lymph Last edited by Resa Delon ORN, COT on 05/02/2024  7:50 AM.     ALLERGIES No Known Allergies PAST MEDICAL HISTORY Past Medical History:  Diagnosis Date   DM (diabetes mellitus), type 2 (HCC)    Hypertension    Morbid obesity (HCC)    Past Surgical History:  Procedure Laterality Date   KNEE SURGERY Right 2001   screws placed below kneecap   FAMILY HISTORY History reviewed. No pertinent family history. SOCIAL HISTORY Social History   Tobacco Use   Smoking status: Never    Passive exposure: Past   Smokeless tobacco: Never  Vaping Use   Vaping status: Never Used  Substance Use Topics   Alcohol use: Yes    Alcohol/week: 6.0 standard drinks of alcohol    Types: 6 Standard drinks or equivalent per week    Comment: occasional   Drug use: Yes    Types: Marijuana       OPHTHALMIC EXAM:  Base Eye Exam     Visual Acuity (Snellen - Linear)       Right Left   Dist [place] 20/30 -2 20/30 -2   Dist ph [place] 20/25 -2 20/25 -2         Tonometry (Tonopen, 7:58 AM)  Right Left   Pressure 20 19  squeezing        Pupils       Pupils Dark Light Shape React APD   Right PERRL 3 2 Round Brisk None   Left PERRL 3 2 Round Brisk None         Visual Fields       Left Right    Full Full         Extraocular Movement       Right Left    Full, Ortho Full, Ortho         Neuro/Psych     Oriented x3: Yes   Mood/Affect: Normal         Dilation     Both eyes: 2.5% Phenylephrine @ 7:58 AM           Slit Lamp and Fundus Exam     Slit Lamp Exam       Right Left   Lids/Lashes Normal Normal   Conjunctiva/Sclera Mild pingeucula Mild pingeucula   Cornea Clear Clear   Anterior Chamber deep and clear deep and clear   Iris Round  and dilated, No NVI Round and dilated, No NVI   Lens Clear Clear   Anterior Vitreous mild syneresis mild syneresis         Fundus Exam       Right Left   Disc Pink and Sharp, mild PPP Pink and Sharp, no NVD   C/D Ratio 0.4 0.4   Macula Flat, good foveal reflex, scattered MA/DBH and punctate exudates greatest tempora mac, interval improvement in central edema Flat, good foveal reflex, scattered MA/DBH and punctate exudates, mild interval improvement in central edema   Vessels attenuated, Tortuous attenuated, Tortuous   Periphery Attached, 360 MA/DBH and punctate exudates greatest posteriorly Attached, 360 MA/DBH and punctate exudates greatest posteriorly           IMAGING AND PROCEDURES  Imaging and Procedures for 05/02/2024  OCT, Retina - OU - Both Eyes       Right Eye Quality was good. Central Foveal Thickness: 292. Progression has improved. Findings include no SRF, abnormal foveal contour, intraretinal hyper-reflective material, intraretinal fluid, vitreomacular adhesion (Mild interval improvement in IRF, edema and foveal contour).   Left Eye Quality was good. Central Foveal Thickness: 351. Progression has improved. Findings include no SRF, abnormal foveal contour, intraretinal hyper-reflective material, intraretinal fluid, vitreomacular adhesion (Persistent IRF/edema superior macula and fovea--slightly improved).   Notes *Images captured and stored on drive  Diagnosis / Impression:  OD: Mild interval improvement in IRF, edema and foveal contour OS: Persistent IRF/edema superior macula and fovea--slightly improved  Clinical management:  See below  Abbreviations: NFP - Normal foveal profile. CME - cystoid macular edema. PED - pigment epithelial detachment. IRF - intraretinal fluid. SRF - subretinal fluid. EZ - ellipsoid zone. ERM - epiretinal membrane. ORA - outer retinal atrophy. ORT - outer retinal tubulation. SRHM - subretinal hyper-reflective material. IRHM -  intraretinal hyper-reflective material      Intravitreal Injection, Pharmacologic Agent - OD - Right Eye       Time Out 05/02/2024. 8:17 AM. Confirmed correct patient, procedure, site, and patient consented.   Anesthesia Topical anesthesia was used. Anesthetic medications included Lidocaine 2%, Proparacaine 0.5%.   Procedure Preparation included 5% betadine to ocular surface, eyelid speculum. A (32g) needle was used.   Injection: 1.25 mg Bevacizumab  1.25mg /0.42ml   Route: Intravitreal, Site: Right Eye   NDC: C2662926, Lot: [id number], Expiration date: 07/13/2024  Post-op Post injection exam found visual acuity of at least counting fingers. The patient tolerated the procedure well. There were no complications. The patient received written and verbal post procedure care education.      Intravitreal Injection, Pharmacologic Agent - OS - Left Eye       Time Out 05/02/2024. 8:18 AM. Confirmed correct patient, procedure, site, and patient consented.   Anesthesia Topical anesthesia was used. Anesthetic medications included Lidocaine 2%, Proparacaine 0.5%.   Procedure Preparation included 5% betadine to ocular surface, eyelid speculum. A supplied (32g) needle was used.   Injection: 1.25 mg Bevacizumab  1.25mg /0.34ml   Route: Intravitreal, Site: Left Eye   NDC: C2662926, Lot: [id number]$ , Expiration date: 05/19/2024   Post-op Post injection exam found visual acuity of at least counting fingers. The patient tolerated the procedure well. There were no complications. The patient received written and verbal post procedure care education.           ASSESSMENT/PLAN:   ICD-10-CM   1. Moderate nonproliferative diabetic retinopathy of both eyes with macular edema associated with type 2 diabetes mellitus (HCC)  E11.3313 OCT, Retina - OU - Both Eyes    Intravitreal Injection, Pharmacologic Agent - OD - Right Eye    Intravitreal Injection, Pharmacologic Agent - OS - Left Eye     Bevacizumab  (AVASTIN ) SOLN 1.25 mg    Bevacizumab  (AVASTIN ) SOLN 1.25 mg    2. Long-term (current) use of injectable non-insulin  antidiabetic drugs  Z79.85     3. Hypertensive retinopathy of both eyes  H35.033       1,2. Moderate non-proliferative diabetic retinopathy, both eyes  - s/p IVA OD #1 (07.15.25), s/p IVA OS #1 (07.17.25)  - s/p IVA OU #2 (08.19.25)  - A1c: 11.1 on 04.01.25 - exam shows scattered MA OU, no NV OU - FA (02.04.25) shows late leaking MA's, no NV OU - OCT shows OD: Mild interval improvement in IRF, edema and foveal contour; OS: Persistent IRF/edema superior macula and fovea--slightly improved at 4 wks - BCVA 20/25 OU decreased from 20/20 OU - recommend IVA OU #3 today, 09.16.25 - pt wishes to proceed with injections - RBA of procedure discussed, questions answered - IVA informed consent obtained and signed, 07.15.25 (OU) - see procedure note - f/u 4 weeks -- DFE/OCT, possible injections  3,4. Hypertensive retinopathy OU - discussed importance of tight BP control - monitor  Ophthalmic Meds Ordered this visit:  Meds ordered this encounter  Medications   Bevacizumab  (AVASTIN ) SOLN 1.25 mg   Bevacizumab  (AVASTIN ) SOLN 1.25 mg     Return in about 4 weeks (around 05/30/2024) for NPDR OU, DFE, OCT, Possible Injxn.  There are no Patient Instructions on file for this visit.  Explained the diagnoses, plan, and follow up with the patient and they expressed understanding.  Patient expressed understanding of the importance of proper follow up care.   This document serves as a record of services personally performed by Redell JUDITHANN Hans, MD, PhD. It was created on their behalf by Wanda GEANNIE Keens, COT an ophthalmic technician. The creation of this record is the provider's dictation and/or activities during the visit.    Electronically signed by:  Wanda GEANNIE Keens, COT  05/10/24 6:26 PM  This document serves as a record of services personally performed by Redell JUDITHANN Hans, MD, PhD. It was created on their behalf by Almetta Pesa, an ophthalmic technician. The creation of this record is the provider's dictation and/or activities during the visit.  Electronically signed by: Almetta Pesa, OA, 05/10/24  6:26 PM  Redell JUDITHANN Hans, M.D., Ph.D. Diseases & Surgery of the Retina and Vitreous [facility]  I have reviewed the above documentation for accuracy and completeness, and I agree with the above. Redell JUDITHANN Hans, M.D., Ph.D. 05/10/24 6:28 PM   Abbreviations: M myopia (nearsighted); A astigmatism; H hyperopia (farsighted); P presbyopia; Mrx spectacle prescription;  CTL contact lenses; OD right eye; OS left eye; OU both eyes  XT exotropia; ET esotropia; PEK punctate epithelial keratitis; PEE punctate epithelial erosions; DES dry eye syndrome; MGD meibomian gland dysfunction; ATs artificial tears; PFAT's preservative free artificial tears; NSC nuclear sclerotic cataract; PSC posterior subcapsular cataract; ERM epi-retinal membrane; PVD posterior vitreous detachment; RD retinal detachment; DM diabetes mellitus; DR diabetic retinopathy; NPDR non-proliferative diabetic retinopathy; PDR proliferative diabetic retinopathy; CSME clinically significant macular edema; DME diabetic macular edema; dbh dot blot hemorrhages; CWS cotton wool spot; POAG primary open angle glaucoma; C/D cup-to-disc ratio; HVF humphrey visual field; GVF goldmann visual field; OCT optical coherence tomography; IOP intraocular pressure; BRVO Branch retinal vein occlusion; CRVO central retinal vein occlusion; CRAO central retinal artery occlusion; BRAO branch retinal artery occlusion; RT retinal tear; SB scleral buckle; PPV pars plana vitrectomy; VH Vitreous hemorrhage; PRP panretinal laser photocoagulation; IVK intravitreal kenalog; VMT vitreomacular traction; MH Macular hole;  NVD neovascularization of the disc; NVE neovascularization elsewhere; AREDS age related eye  disease study; ARMD age related macular degeneration; POAG primary open angle glaucoma; EBMD epithelial/anterior basement membrane dystrophy; ACIOL anterior chamber intraocular lens; IOL intraocular lens; PCIOL posterior chamber intraocular lens; Phaco/IOL phacoemulsification with intraocular lens placement; PRK photorefractive keratectomy; LASIK laser assisted in situ keratomileusis; HTN hypertension; DM diabetes mellitus; COPD chronic obstructive pulmonary disease

## 2024-05-02 ENCOUNTER — Ambulatory Visit (INDEPENDENT_AMBULATORY_CARE_PROVIDER_SITE_OTHER): Admitting: Ophthalmology

## 2024-05-02 ENCOUNTER — Encounter (INDEPENDENT_AMBULATORY_CARE_PROVIDER_SITE_OTHER): Payer: Self-pay | Admitting: Ophthalmology

## 2024-05-02 DIAGNOSIS — E113313 Type 2 diabetes mellitus with moderate nonproliferative diabetic retinopathy with macular edema, bilateral: Secondary | ICD-10-CM

## 2024-05-02 DIAGNOSIS — H35033 Hypertensive retinopathy, bilateral: Secondary | ICD-10-CM

## 2024-05-02 DIAGNOSIS — I10 Essential (primary) hypertension: Secondary | ICD-10-CM

## 2024-05-02 DIAGNOSIS — Z7985 Long-term (current) use of injectable non-insulin antidiabetic drugs: Secondary | ICD-10-CM | POA: Diagnosis not present

## 2024-05-02 MED ORDER — BEVACIZUMAB CHEMO INJECTION 1.25MG/0.05ML SYRINGE FOR KALEIDOSCOPE
1.2500 mg | INTRAVITREAL | Status: AC | PRN
  Administered 2024-05-02: 1.25 mg via INTRAVITREAL

## 2024-05-10 ENCOUNTER — Encounter (INDEPENDENT_AMBULATORY_CARE_PROVIDER_SITE_OTHER): Payer: Self-pay | Admitting: Ophthalmology

## 2024-05-16 NOTE — Progress Notes (Shared)
 [facility] - Clinic Note  05/30/2024   CHIEF COMPLAINT Patient presents for No chief complaint on file.  HISTORY OF PRESENT ILLNESS: Peter Powell is a 38 y.o. male who presents to the clinic today for:   Pt states   Referring physician: Frazier, [place], OD [address] [place],  [name] [address]  HISTORICAL INFORMATION:  Selected notes from the MEDICAL RECORD NUMBER Referred by Dr. [name] for concern of NPDR LEE:  Ocular Hx- PMH-   CURRENT MEDICATIONS: No current outpatient medications on file. (Ophthalmic Drugs)   No current facility-administered medications for this visit. (Ophthalmic Drugs)   Current Outpatient Medications (Other)  Medication Sig   amLODipine  (NORVASC ) 5 MG tablet TAKE 1 TABLET(5 MG) BY MOUTH DAILY   atorvastatin  (LIPITOR) 80 MG tablet Take 1 tablet (80 mg total) by mouth daily.   Blood Glucose Monitoring Suppl DEVI 1 each by Does not apply route in the morning, at noon, and at bedtime. May substitute to any manufacturer covered by patient's insurance.   ezetimibe  (ZETIA ) 10 MG tablet TAKE 1 TABLET(10 MG) BY MOUTH DAILY   glimepiride  (AMARYL ) 4 MG tablet Take 1 tablet (4 mg total) by mouth daily before breakfast.   tirzepatide  (MOUNJARO ) 10 MG/0.5ML Pen Inject 10 mg into the skin once a week.   valsartan -hydrochlorothiazide  (DIOVAN -HCT) 160-25 MG tablet TAKE 1 TABLET BY MOUTH EVERY DAY   No current facility-administered medications for this visit. (Other)   REVIEW OF SYSTEMS:   ALLERGIES No Known Allergies PAST MEDICAL HISTORY Past Medical History:  Diagnosis Date   DM (diabetes mellitus), type 2 (HCC)    Hypertension    Morbid obesity (HCC)    Past Surgical History:  Procedure Laterality Date   KNEE SURGERY Right 2001   screws placed below kneecap   FAMILY HISTORY No family history on file. SOCIAL HISTORY Social History   Tobacco Use   Smoking status: Never    Passive exposure: Past   Smokeless  tobacco: Never  Vaping Use   Vaping status: Never Used  Substance Use Topics   Alcohol use: Yes    Alcohol/week: 6.0 standard drinks of alcohol    Types: 6 Standard drinks or equivalent per week    Comment: occasional   Drug use: Yes    Types: Marijuana       OPHTHALMIC EXAM:  Not recorded    IMAGING AND PROCEDURES  Imaging and Procedures for 05/30/2024         ASSESSMENT/PLAN: No diagnosis found.   1,2. Moderate non-proliferative diabetic retinopathy, both eyes  - s/p IVA OD #1 (07.15.25), s/p IVA OS #1 (07.17.25)  - s/p IVA OU #2 (08.19.25), #3 (09.16.25)  - A1c: 11.1 on 04.01.25 - exam shows scattered MA OU, no NV OU - FA (02.04.25) shows late leaking MA's, no NV OU - OCT shows OD: Mild interval improvement in IRF, edema and foveal contour; OS: Persistent IRF/edema superior macula and fovea--slightly improved at 4 wks - BCVA 20/25 OU decreased from 20/20 OU - recommend IVA OU #4 today, 10.14.25 - pt wishes to proceed with injections - RBA of procedure discussed, questions answered - IVA informed consent obtained and signed, 07.15.25 (OU) - see procedure note - f/u 4 weeks -- DFE/OCT, possible injections  3,4. Hypertensive retinopathy OU - discussed importance of tight BP control - monitor  Ophthalmic Meds Ordered this visit:  No orders of the defined types were placed in this encounter.    No follow-ups on  file.  There are no Patient Instructions on file for this visit.  Explained the diagnoses, plan, and follow up with the patient and they expressed understanding.  Patient expressed understanding of the importance of proper follow up care.   This document serves as a record of services personally performed by Redell JUDITHANN Hans, MD, PhD. It was created on their behalf by Wanda GEANNIE Keens, COT an ophthalmic technician. The creation of this record is the provider's dictation and/or activities during the visit.    Electronically signed by:  Wanda GEANNIE Keens, COT  05/16/24 7:27 AM  Redell JUDITHANN Hans, M.D., Ph.D. Diseases & Surgery of the Retina and Vitreous [facility]   Abbreviations: M myopia (nearsighted); A astigmatism; H hyperopia (farsighted); P presbyopia; Mrx spectacle prescription;  CTL contact lenses; OD right eye; OS left eye; OU both eyes  XT exotropia; ET esotropia; PEK punctate epithelial keratitis; PEE punctate epithelial erosions; DES dry eye syndrome; MGD meibomian gland dysfunction; ATs artificial tears; PFAT's preservative free artificial tears; NSC nuclear sclerotic cataract; PSC posterior subcapsular cataract; ERM epi-retinal membrane; PVD posterior vitreous detachment; RD retinal detachment; DM diabetes mellitus; DR diabetic retinopathy; NPDR non-proliferative diabetic retinopathy; PDR proliferative diabetic retinopathy; CSME clinically significant macular edema; DME diabetic macular edema; dbh dot blot hemorrhages; CWS cotton wool spot; POAG primary open angle glaucoma; C/D cup-to-disc ratio; HVF humphrey visual field; GVF goldmann visual field; OCT optical coherence tomography; IOP intraocular pressure; BRVO Branch retinal vein occlusion; CRVO central retinal vein occlusion; CRAO central retinal artery occlusion; BRAO branch retinal artery occlusion; RT retinal tear; SB scleral buckle; PPV pars plana vitrectomy; VH Vitreous hemorrhage; PRP panretinal laser photocoagulation; IVK intravitreal kenalog; VMT vitreomacular traction; MH Macular hole;  NVD neovascularization of the disc; NVE neovascularization elsewhere; AREDS age related eye disease study; ARMD age related macular degeneration; POAG primary open angle glaucoma; EBMD epithelial/anterior basement membrane dystrophy; ACIOL anterior chamber intraocular lens; IOL intraocular lens; PCIOL posterior chamber intraocular lens; Phaco/IOL phacoemulsification with intraocular lens placement; PRK photorefractive keratectomy; LASIK laser assisted in situ  keratomileusis; HTN hypertension; DM diabetes mellitus; COPD chronic obstructive pulmonary disease

## 2024-05-18 ENCOUNTER — Other Ambulatory Visit: Payer: Self-pay | Admitting: Internal Medicine

## 2024-05-18 DIAGNOSIS — E1169 Type 2 diabetes mellitus with other specified complication: Secondary | ICD-10-CM

## 2024-05-23 ENCOUNTER — Ambulatory Visit: Admitting: Endocrinology

## 2024-05-30 ENCOUNTER — Encounter (INDEPENDENT_AMBULATORY_CARE_PROVIDER_SITE_OTHER): Admitting: Ophthalmology

## 2024-05-30 DIAGNOSIS — I10 Essential (primary) hypertension: Secondary | ICD-10-CM

## 2024-05-30 DIAGNOSIS — E113313 Type 2 diabetes mellitus with moderate nonproliferative diabetic retinopathy with macular edema, bilateral: Secondary | ICD-10-CM

## 2024-05-30 DIAGNOSIS — H35033 Hypertensive retinopathy, bilateral: Secondary | ICD-10-CM

## 2024-05-30 DIAGNOSIS — Z7985 Long-term (current) use of injectable non-insulin antidiabetic drugs: Secondary | ICD-10-CM

## 2024-05-30 NOTE — Progress Notes (Shared)
 [facility] - Clinic Note  06/05/2024   CHIEF COMPLAINT Patient presents for No chief complaint on file.  HISTORY OF PRESENT ILLNESS: Peter Powell is a 38 y.o. male who presents to the clinic today for:   Pt states   Referring physician: Frazier, [place], OD [address] [name],  [name] [address]  HISTORICAL INFORMATION:  Selected notes from the MEDICAL RECORD NUMBER Referred by Dr. [name] for concern of NPDR LEE:  Ocular Hx- PMH-   CURRENT MEDICATIONS: No current outpatient medications on file. (Ophthalmic Drugs)   No current facility-administered medications for this visit. (Ophthalmic Drugs)   Current Outpatient Medications (Other)  Medication Sig   amLODipine  (NORVASC ) 5 MG tablet TAKE 1 TABLET(5 MG) BY MOUTH DAILY   atorvastatin  (LIPITOR) 80 MG tablet TAKE 1 TABLET(80 MG) BY MOUTH DAILY   Blood Glucose Monitoring Suppl DEVI 1 each by Does not apply route in the morning, at noon, and at bedtime. May substitute to any manufacturer covered by patient's insurance.   ezetimibe  (ZETIA ) 10 MG tablet TAKE 1 TABLET(10 MG) BY MOUTH DAILY   glimepiride  (AMARYL ) 4 MG tablet Take 1 tablet (4 mg total) by mouth daily before breakfast.   tirzepatide  (MOUNJARO ) 10 MG/0.5ML Pen Inject 10 mg into the skin once a week.   valsartan -hydrochlorothiazide  (DIOVAN -HCT) 160-25 MG tablet TAKE 1 TABLET BY MOUTH EVERY DAY   No current facility-administered medications for this visit. (Other)   REVIEW OF SYSTEMS:   ALLERGIES No Known Allergies PAST MEDICAL HISTORY Past Medical History:  Diagnosis Date   DM (diabetes mellitus), type 2 (HCC)    Hypertension    Morbid obesity (HCC)    Past Surgical History:  Procedure Laterality Date   KNEE SURGERY Right 2001   screws placed below kneecap   FAMILY HISTORY No family history on file. SOCIAL HISTORY Social History   Tobacco Use   Smoking status: Never    Passive exposure: Past   Smokeless tobacco: Never   Vaping Use   Vaping status: Never Used  Substance Use Topics   Alcohol use: Yes    Alcohol/week: 6.0 standard drinks of alcohol    Types: 6 Standard drinks or equivalent per week    Comment: occasional   Drug use: Yes    Types: Marijuana       OPHTHALMIC EXAM:  Not recorded    IMAGING AND PROCEDURES  Imaging and Procedures for 06/05/2024         ASSESSMENT/PLAN:   ICD-10-CM   1. Moderate nonproliferative diabetic retinopathy of both eyes with macular edema associated with type 2 diabetes mellitus (HCC)  Z88.6686     2. Long-term (current) use of injectable non-insulin  antidiabetic drugs  Z79.85     3. Hypertensive retinopathy of both eyes  H35.033     4. Essential hypertension  I10      1,2. Moderate non-proliferative diabetic retinopathy, both eyes  - s/p IVA OD #1 (07.15.25), s/p IVA OS #1 (07.17.25)  - s/p IVA OU #2 (08.19.25), #3 (09.16.25)  - A1c: 11.1 on 04.01.25 - exam shows scattered MA OU, no NV OU - FA (02.04.25) shows late leaking MA's, no NV OU - OCT shows OD: Mild interval improvement in IRF, edema and foveal contour; OS: Persistent IRF/edema superior macula and fovea--slightly improved at 4 wks - BCVA 20/25 OU decreased from 20/20 OU - recommend IVA today OU #4 (10.20.25) - pt wishes to proceed with injections - RBA of procedure discussed, questions answered -  IVA informed consent obtained and signed, 07.15.25 (OU) - see procedure note - f/u 4 weeks -- DFE/OCT, possible injections  3,4. Hypertensive retinopathy OU - discussed importance of tight BP control - monitor  Ophthalmic Meds Ordered this visit:  No orders of the defined types were placed in this encounter.    No follow-ups on file.  There are no Patient Instructions on file for this visit.  Explained the diagnoses, plan, and follow up with the patient and they expressed understanding.  Patient expressed understanding of the importance of proper follow up care.   This document  serves as a record of services personally performed by Redell JUDITHANN Hans, MD, PhD. It was created on their behalf by Avelina Pereyra, COA an ophthalmic technician. The creation of this record is the provider's dictation and/or activities during the visit.   Electronically signed by: Avelina GORMAN Pereyra, COT  06/05/24  7:20 AM   This document serves as a record of services personally performed by Redell JUDITHANN Hans, MD, PhD. It was created on their behalf by Wanda GEANNIE Keens, COT an ophthalmic technician. The creation of this record is the provider's dictation and/or activities during the visit.    Electronically signed by:  Wanda GEANNIE Keens, COT  06/05/24 7:21 AM  Redell JUDITHANN Hans, M.D., Ph.D. Diseases & Surgery of the Retina and Vitreous [facility]   Abbreviations: M myopia (nearsighted); A astigmatism; H hyperopia (farsighted); P presbyopia; Mrx spectacle prescription;  CTL contact lenses; OD right eye; OS left eye; OU both eyes  XT exotropia; ET esotropia; PEK punctate epithelial keratitis; PEE punctate epithelial erosions; DES dry eye syndrome; MGD meibomian gland dysfunction; ATs artificial tears; PFAT's preservative free artificial tears; NSC nuclear sclerotic cataract; PSC posterior subcapsular cataract; ERM epi-retinal membrane; PVD posterior vitreous detachment; RD retinal detachment; DM diabetes mellitus; DR diabetic retinopathy; NPDR non-proliferative diabetic retinopathy; PDR proliferative diabetic retinopathy; CSME clinically significant macular edema; DME diabetic macular edema; dbh dot blot hemorrhages; CWS cotton wool spot; POAG primary open angle glaucoma; C/D cup-to-disc ratio; HVF humphrey visual field; GVF goldmann visual field; OCT optical coherence tomography; IOP intraocular pressure; BRVO Branch retinal vein occlusion; CRVO central retinal vein occlusion; CRAO central retinal artery occlusion; BRAO branch retinal artery occlusion; RT retinal tear; SB scleral  buckle; PPV pars plana vitrectomy; VH Vitreous hemorrhage; PRP panretinal laser photocoagulation; IVK intravitreal kenalog; VMT vitreomacular traction; MH Macular hole;  NVD neovascularization of the disc; NVE neovascularization elsewhere; AREDS age related eye disease study; ARMD age related macular degeneration; POAG primary open angle glaucoma; EBMD epithelial/anterior basement membrane dystrophy; ACIOL anterior chamber intraocular lens; IOL intraocular lens; PCIOL posterior chamber intraocular lens; Phaco/IOL phacoemulsification with intraocular lens placement; PRK photorefractive keratectomy; LASIK laser assisted in situ keratomileusis; HTN hypertension; DM diabetes mellitus; COPD chronic obstructive pulmonary disease

## 2024-06-05 ENCOUNTER — Encounter (INDEPENDENT_AMBULATORY_CARE_PROVIDER_SITE_OTHER): Admitting: Ophthalmology

## 2024-06-05 DIAGNOSIS — E113313 Type 2 diabetes mellitus with moderate nonproliferative diabetic retinopathy with macular edema, bilateral: Secondary | ICD-10-CM

## 2024-06-05 DIAGNOSIS — Z7985 Long-term (current) use of injectable non-insulin antidiabetic drugs: Secondary | ICD-10-CM

## 2024-06-05 DIAGNOSIS — I10 Essential (primary) hypertension: Secondary | ICD-10-CM

## 2024-06-05 DIAGNOSIS — H35033 Hypertensive retinopathy, bilateral: Secondary | ICD-10-CM

## 2024-06-06 NOTE — Progress Notes (Shared)
 [facility] - Clinic Note  06/13/2024   CHIEF COMPLAINT Patient presents for No chief complaint on file.  HISTORY OF PRESENT ILLNESS: Peter Powell is a 38 y.o. male who presents to the clinic today for:   Pt states   Referring physician: Frazier, [place], OD [address] [name],  [name] [address]  HISTORICAL INFORMATION:  Selected notes from the MEDICAL RECORD NUMBER Referred by Dr. [name] for concern of NPDR LEE:  Ocular Hx- PMH-   CURRENT MEDICATIONS: No current outpatient medications on file. (Ophthalmic Drugs)   No current facility-administered medications for this visit. (Ophthalmic Drugs)   Current Outpatient Medications (Other)  Medication Sig   amLODipine  (NORVASC ) 5 MG tablet TAKE 1 TABLET(5 MG) BY MOUTH DAILY   atorvastatin  (LIPITOR) 80 MG tablet TAKE 1 TABLET(80 MG) BY MOUTH DAILY   Blood Glucose Monitoring Suppl DEVI 1 each by Does not apply route in the morning, at noon, and at bedtime. May substitute to any manufacturer covered by patient's insurance.   ezetimibe  (ZETIA ) 10 MG tablet TAKE 1 TABLET(10 MG) BY MOUTH DAILY   glimepiride  (AMARYL ) 4 MG tablet Take 1 tablet (4 mg total) by mouth daily before breakfast.   tirzepatide  (MOUNJARO ) 10 MG/0.5ML Pen Inject 10 mg into the skin once a week.   valsartan -hydrochlorothiazide  (DIOVAN -HCT) 160-25 MG tablet TAKE 1 TABLET BY MOUTH EVERY DAY   No current facility-administered medications for this visit. (Other)   REVIEW OF SYSTEMS:   ALLERGIES No Known Allergies PAST MEDICAL HISTORY Past Medical History:  Diagnosis Date   DM (diabetes mellitus), type 2 (HCC)    Hypertension    Morbid obesity (HCC)    Past Surgical History:  Procedure Laterality Date   KNEE SURGERY Right 2001   screws placed below kneecap   FAMILY HISTORY No family history on file. SOCIAL HISTORY Social History   Tobacco Use   Smoking status: Never    Passive exposure: Past   Smokeless tobacco: Never   Vaping Use   Vaping status: Never Used  Substance Use Topics   Alcohol use: Yes    Alcohol/week: 6.0 standard drinks of alcohol    Types: 6 Standard drinks or equivalent per week    Comment: occasional   Drug use: Yes    Types: Marijuana       OPHTHALMIC EXAM:  Not recorded    IMAGING AND PROCEDURES  Imaging and Procedures for 06/13/2024         ASSESSMENT/PLAN: No diagnosis found.  1,2. Moderate non-proliferative diabetic retinopathy, both eyes  - s/p IVA OD #1 (07.15.25), s/p IVA OS #1 (07.17.25)  - s/p IVA OU #2 (08.19.25), #3 (09.16.25)  - A1c: 11.1 on 04.01.25 - exam shows scattered MA OU, no NV OU - FA (02.04.25) shows late leaking MA's, no NV OU - OCT shows OD: Mild interval improvement in IRF, edema and foveal contour; OS: Persistent IRF/edema superior macula and fovea--slightly improved at 4 wks - BCVA 20/25 OU decreased from 20/20 OU - recommend IVA today OU #4 (10.28.25) - pt wishes to proceed with injections - RBA of procedure discussed, questions answered - IVA informed consent obtained and signed, 07.15.25 (OU) - see procedure note - f/u 4 weeks -- DFE/OCT, possible injections  3,4. Hypertensive retinopathy OU - discussed importance of tight BP control - monitor  Ophthalmic Meds Ordered this visit:  No orders of the defined types were placed in this encounter.    No follow-ups on file.  There  are no Patient Instructions on file for this visit.  Explained the diagnoses, plan, and follow up with the patient and they expressed understanding.  Patient expressed understanding of the importance of proper follow up care.    This document serves as a record of services personally performed by Redell JUDITHANN Hans, MD, PhD. It was created on their behalf by Wanda GEANNIE Keens, COT an ophthalmic technician. The creation of this record is the provider's dictation and/or activities during the visit.    Electronically signed by:  Wanda GEANNIE Keens, COT   06/06/24 7:15 AM  Redell JUDITHANN Hans, M.D., Ph.D. Diseases & Surgery of the Retina and Vitreous [facility]   Abbreviations: M myopia (nearsighted); A astigmatism; H hyperopia (farsighted); P presbyopia; Mrx spectacle prescription;  CTL contact lenses; OD right eye; OS left eye; OU both eyes  XT exotropia; ET esotropia; PEK punctate epithelial keratitis; PEE punctate epithelial erosions; DES dry eye syndrome; MGD meibomian gland dysfunction; ATs artificial tears; PFAT's preservative free artificial tears; NSC nuclear sclerotic cataract; PSC posterior subcapsular cataract; ERM epi-retinal membrane; PVD posterior vitreous detachment; RD retinal detachment; DM diabetes mellitus; DR diabetic retinopathy; NPDR non-proliferative diabetic retinopathy; PDR proliferative diabetic retinopathy; CSME clinically significant macular edema; DME diabetic macular edema; dbh dot blot hemorrhages; CWS cotton wool spot; POAG primary open angle glaucoma; C/D cup-to-disc ratio; HVF humphrey visual field; GVF goldmann visual field; OCT optical coherence tomography; IOP intraocular pressure; BRVO Branch retinal vein occlusion; CRVO central retinal vein occlusion; CRAO central retinal artery occlusion; BRAO branch retinal artery occlusion; RT retinal tear; SB scleral buckle; PPV pars plana vitrectomy; VH Vitreous hemorrhage; PRP panretinal laser photocoagulation; IVK intravitreal kenalog; VMT vitreomacular traction; MH Macular hole;  NVD neovascularization of the disc; NVE neovascularization elsewhere; AREDS age related eye disease study; ARMD age related macular degeneration; POAG primary open angle glaucoma; EBMD epithelial/anterior basement membrane dystrophy; ACIOL anterior chamber intraocular lens; IOL intraocular lens; PCIOL posterior chamber intraocular lens; Phaco/IOL phacoemulsification with intraocular lens placement; PRK photorefractive keratectomy; LASIK laser assisted in situ keratomileusis; HTN  hypertension; DM diabetes mellitus; COPD chronic obstructive pulmonary disease

## 2024-06-13 ENCOUNTER — Encounter (INDEPENDENT_AMBULATORY_CARE_PROVIDER_SITE_OTHER): Admitting: Ophthalmology

## 2024-06-13 DIAGNOSIS — H35033 Hypertensive retinopathy, bilateral: Secondary | ICD-10-CM

## 2024-06-13 DIAGNOSIS — E113313 Type 2 diabetes mellitus with moderate nonproliferative diabetic retinopathy with macular edema, bilateral: Secondary | ICD-10-CM

## 2024-06-13 DIAGNOSIS — I10 Essential (primary) hypertension: Secondary | ICD-10-CM

## 2024-06-13 DIAGNOSIS — Z7985 Long-term (current) use of injectable non-insulin antidiabetic drugs: Secondary | ICD-10-CM

## 2024-06-14 NOTE — Progress Notes (Shared)
 [facility] - Clinic Note  06/16/2024   CHIEF COMPLAINT Patient presents for No chief complaint on file.  HISTORY OF PRESENT ILLNESS: Peter Powell is a 38 y.o. male who presents to the clinic today for:   Pt states   Referring physician: Frazier, [place], OD [address] [place] [name],  [name] [address]  HISTORICAL INFORMATION:  Selected notes from the MEDICAL RECORD NUMBER Referred by Dr. [name] for concern of NPDR LEE:  Ocular Hx- PMH-   CURRENT MEDICATIONS: No current outpatient medications on file. (Ophthalmic Drugs)   No current facility-administered medications for this visit. (Ophthalmic Drugs)   Current Outpatient Medications (Other)  Medication Sig   amLODipine  (NORVASC ) 5 MG tablet TAKE 1 TABLET(5 MG) BY MOUTH DAILY   atorvastatin  (LIPITOR) 80 MG tablet TAKE 1 TABLET(80 MG) BY MOUTH DAILY   Blood Glucose Monitoring Suppl DEVI 1 each by Does not apply route in the morning, at noon, and at bedtime. May substitute to any manufacturer covered by patient's insurance.   ezetimibe  (ZETIA ) 10 MG tablet TAKE 1 TABLET(10 MG) BY MOUTH DAILY   glimepiride  (AMARYL ) 4 MG tablet Take 1 tablet (4 mg total) by mouth daily before breakfast.   tirzepatide  (MOUNJARO ) 10 MG/0.5ML Pen Inject 10 mg into the skin once a week.   valsartan -hydrochlorothiazide  (DIOVAN -HCT) 160-25 MG tablet TAKE 1 TABLET BY MOUTH EVERY DAY   No current facility-administered medications for this visit. (Other)   REVIEW OF SYSTEMS:   ALLERGIES No Known Allergies PAST MEDICAL HISTORY Past Medical History:  Diagnosis Date   DM (diabetes mellitus), type 2 (HCC)    Hypertension    Morbid obesity (HCC)    Past Surgical History:  Procedure Laterality Date   KNEE SURGERY Right 2001   screws placed below kneecap   FAMILY HISTORY No family history on file. SOCIAL HISTORY Social History   Tobacco Use   Smoking status: Never    Passive exposure: Past   Smokeless tobacco: Never   Vaping Use   Vaping status: Never Used  Substance Use Topics   Alcohol use: Yes    Alcohol/week: 6.0 standard drinks of alcohol    Types: 6 Standard drinks or equivalent per week    Comment: occasional   Drug use: Yes    Types: Marijuana       OPHTHALMIC EXAM:  Not recorded    IMAGING AND PROCEDURES  Imaging and Procedures for 06/16/2024         ASSESSMENT/PLAN: No diagnosis found.  1,2. Moderate non-proliferative diabetic retinopathy, both eyes  - s/p IVA OD #1 (07.15.25), s/p IVA OS #1 (07.17.25)  - s/p IVA OU #2 (08.19.25), #3 (09.16.25)  - A1c: 11.1 on 04.01.25 - exam shows scattered MA OU, no NV OU - FA (02.04.25) shows late leaking MA's, no NV OU - OCT shows OD: Mild interval improvement in IRF, edema and foveal contour; OS: Persistent IRF/edema superior macula and fovea--slightly improved at 4 wks - BCVA 20/25 OU decreased from 20/20 OU - recommend IVA today OU #4 (10.31.25) - pt wishes to proceed with injections - RBA of procedure discussed, questions answered - IVA informed consent obtained and signed, 07.15.25 (OU) - see procedure note - f/u 4 weeks -- DFE/OCT, possible injections  3,4. Hypertensive retinopathy OU - discussed importance of tight BP control - monitor  Ophthalmic Meds Ordered this visit:  No orders of the defined types were placed in this encounter.    No follow-ups on file.  There  are no Patient Instructions on file for this visit.  Explained the diagnoses, plan, and follow up with the patient and they expressed understanding.  Patient expressed understanding of the importance of proper follow up care.   This document serves as a record of services personally performed by Redell JUDITHANN Hans, MD, PhD. It was created on their behalf by Delon Newness COT, an ophthalmic technician. The creation of this record is the provider's dictation and/or activities during the visit.    Electronically signed by: Delon Newness COT 10.29.25   8:11 AM   Redell JUDITHANN Hans, M.D., Ph.D. Diseases & Surgery of the Retina and Vitreous [facility]   Abbreviations: M myopia (nearsighted); A astigmatism; H hyperopia (farsighted); P presbyopia; Mrx spectacle prescription;  CTL contact lenses; OD right eye; OS left eye; OU both eyes  XT exotropia; ET esotropia; PEK punctate epithelial keratitis; PEE punctate epithelial erosions; DES dry eye syndrome; MGD meibomian gland dysfunction; ATs artificial tears; PFAT's preservative free artificial tears; NSC nuclear sclerotic cataract; PSC posterior subcapsular cataract; ERM epi-retinal membrane; PVD posterior vitreous detachment; RD retinal detachment; DM diabetes mellitus; DR diabetic retinopathy; NPDR non-proliferative diabetic retinopathy; PDR proliferative diabetic retinopathy; CSME clinically significant macular edema; DME diabetic macular edema; dbh dot blot hemorrhages; CWS cotton wool spot; POAG primary open angle glaucoma; C/D cup-to-disc ratio; HVF humphrey visual field; GVF goldmann visual field; OCT optical coherence tomography; IOP intraocular pressure; BRVO Branch retinal vein occlusion; CRVO central retinal vein occlusion; CRAO central retinal artery occlusion; BRAO branch retinal artery occlusion; RT retinal tear; SB scleral buckle; PPV pars plana vitrectomy; VH Vitreous hemorrhage; PRP panretinal laser photocoagulation; IVK intravitreal kenalog; VMT vitreomacular traction; MH Macular hole;  NVD neovascularization of the disc; NVE neovascularization elsewhere; AREDS age related eye disease study; ARMD age related macular degeneration; POAG primary open angle glaucoma; EBMD epithelial/anterior basement membrane dystrophy; ACIOL anterior chamber intraocular lens; IOL intraocular lens; PCIOL posterior chamber intraocular lens; Phaco/IOL phacoemulsification with intraocular lens placement; PRK photorefractive keratectomy; LASIK laser assisted in situ keratomileusis; HTN  hypertension; DM diabetes mellitus; COPD chronic obstructive pulmonary disease

## 2024-06-16 ENCOUNTER — Encounter (INDEPENDENT_AMBULATORY_CARE_PROVIDER_SITE_OTHER): Admitting: Ophthalmology

## 2024-06-16 DIAGNOSIS — Z7985 Long-term (current) use of injectable non-insulin antidiabetic drugs: Secondary | ICD-10-CM

## 2024-06-16 DIAGNOSIS — E113313 Type 2 diabetes mellitus with moderate nonproliferative diabetic retinopathy with macular edema, bilateral: Secondary | ICD-10-CM

## 2024-06-16 DIAGNOSIS — H35033 Hypertensive retinopathy, bilateral: Secondary | ICD-10-CM

## 2024-06-16 DIAGNOSIS — I10 Essential (primary) hypertension: Secondary | ICD-10-CM

## 2024-06-20 NOTE — Progress Notes (Shared)
 [facility] - Clinic Note  06/27/2024   CHIEF COMPLAINT Patient presents for No chief complaint on file.  HISTORY OF PRESENT ILLNESS: Peter Powell is a 38 y.o. male who presents to the clinic today for:   Pt states   Referring physician: Frazier, Chad, OD [address] [name],  [name] [address]  HISTORICAL INFORMATION:  Selected notes from the MEDICAL RECORD NUMBER Referred by Dr. [name] for concern of NPDR LEE:  Ocular Hx- PMH-   CURRENT MEDICATIONS: No current outpatient medications on file. (Ophthalmic Drugs)   No current facility-administered medications for this visit. (Ophthalmic Drugs)   Current Outpatient Medications (Other)  Medication Sig   amLODipine  (NORVASC ) 5 MG tablet TAKE 1 TABLET(5 MG) BY MOUTH DAILY   atorvastatin  (LIPITOR) 80 MG tablet TAKE 1 TABLET(80 MG) BY MOUTH DAILY   Blood Glucose Monitoring Suppl DEVI 1 each by Does not apply route in the morning, at noon, and at bedtime. May substitute to any manufacturer covered by patient's insurance.   ezetimibe  (ZETIA ) 10 MG tablet TAKE 1 TABLET(10 MG) BY MOUTH DAILY   glimepiride  (AMARYL ) 4 MG tablet Take 1 tablet (4 mg total) by mouth daily before breakfast.   tirzepatide  (MOUNJARO ) 10 MG/0.5ML Pen Inject 10 mg into the skin once a week.   valsartan -hydrochlorothiazide  (DIOVAN -HCT) 160-25 MG tablet TAKE 1 TABLET BY MOUTH EVERY DAY   No current facility-administered medications for this visit. (Other)   REVIEW OF SYSTEMS:   ALLERGIES No Known Allergies PAST MEDICAL HISTORY Past Medical History:  Diagnosis Date   DM (diabetes mellitus), type 2 (HCC)    Hypertension    Morbid obesity (HCC)    Past Surgical History:  Procedure Laterality Date   KNEE SURGERY Right 2001   screws placed below kneecap   FAMILY HISTORY No family history on file. SOCIAL HISTORY Social History   Tobacco Use   Smoking status: Never    Passive exposure: Past   Smokeless tobacco: Never   Vaping Use   Vaping status: Never Used  Substance Use Topics   Alcohol use: Yes    Alcohol/week: 6.0 standard drinks of alcohol    Types: 6 Standard drinks or equivalent per week    Comment: occasional   Drug use: Yes    Types: Marijuana       OPHTHALMIC EXAM:  Not recorded    IMAGING AND PROCEDURES  Imaging and Procedures for 06/27/2024         ASSESSMENT/PLAN: No diagnosis found.  1,2. Moderate non-proliferative diabetic retinopathy, both eyes  - s/p IVA OD #1 (07.15.25), s/p IVA OS #1 (07.17.25)  - s/p IVA OU #2 (08.19.25), #3 (09.16.25)  - A1c: 11.1 on 04.01.25 - exam shows scattered MA OU, no NV OU - FA (02.04.25) shows late leaking MA's, no NV OU - OCT shows OD: Mild interval improvement in IRF, edema and foveal contour; OS: Persistent IRF/edema superior macula and fovea--slightly improved at 4 wks - BCVA 20/25 OU decreased from 20/20 OU - recommend IVA today OU #4 (11.11.25) - pt wishes to proceed with injections - RBA of procedure discussed, questions answered - IVA informed consent obtained and signed, 07.15.25 (OU) - see procedure note - f/u 4 weeks -- DFE/OCT, possible injections  3,4. Hypertensive retinopathy OU - discussed importance of tight BP control - monitor  Ophthalmic Meds Ordered this visit:  No orders of the defined types were placed in this encounter.    No follow-ups on file.  There  are no Patient Instructions on file for this visit.  Explained the diagnoses, plan, and follow up with the patient and they expressed understanding.  Patient expressed understanding of the importance of proper follow up care.   This document serves as a record of services personally performed by Redell JUDITHANN Hans, MD, PhD. It was created on their behalf by Wanda GEANNIE Keens, COT an ophthalmic technician. The creation of this record is the provider's dictation and/or activities during the visit.    Electronically signed by:  Wanda GEANNIE Keens, COT   06/20/24 7:12 AM    Redell JUDITHANN Hans, M.D., Ph.D. Diseases & Surgery of the Retina and Vitreous [facility]   Abbreviations: M myopia (nearsighted); A astigmatism; H hyperopia (farsighted); P presbyopia; Mrx spectacle prescription;  CTL contact lenses; OD right eye; OS left eye; OU both eyes  XT exotropia; ET esotropia; PEK punctate epithelial keratitis; PEE punctate epithelial erosions; DES dry eye syndrome; MGD meibomian gland dysfunction; ATs artificial tears; PFAT's preservative free artificial tears; NSC nuclear sclerotic cataract; PSC posterior subcapsular cataract; ERM epi-retinal membrane; PVD posterior vitreous detachment; RD retinal detachment; DM diabetes mellitus; DR diabetic retinopathy; NPDR non-proliferative diabetic retinopathy; PDR proliferative diabetic retinopathy; CSME clinically significant macular edema; DME diabetic macular edema; dbh dot blot hemorrhages; CWS cotton wool spot; POAG primary open angle glaucoma; C/D cup-to-disc ratio; HVF humphrey visual field; GVF goldmann visual field; OCT optical coherence tomography; IOP intraocular pressure; BRVO Branch retinal vein occlusion; CRVO central retinal vein occlusion; CRAO central retinal artery occlusion; BRAO branch retinal artery occlusion; RT retinal tear; SB scleral buckle; PPV pars plana vitrectomy; VH Vitreous hemorrhage; PRP panretinal laser photocoagulation; IVK intravitreal kenalog; VMT vitreomacular traction; MH Macular hole;  NVD neovascularization of the disc; NVE neovascularization elsewhere; AREDS age related eye disease study; ARMD age related macular degeneration; POAG primary open angle glaucoma; EBMD epithelial/anterior basement membrane dystrophy; ACIOL anterior chamber intraocular lens; IOL intraocular lens; PCIOL posterior chamber intraocular lens; Phaco/IOL phacoemulsification with intraocular lens placement; PRK photorefractive keratectomy; LASIK laser assisted in situ keratomileusis; HTN  hypertension; DM diabetes mellitus; COPD chronic obstructive pulmonary disease

## 2024-06-27 ENCOUNTER — Encounter (INDEPENDENT_AMBULATORY_CARE_PROVIDER_SITE_OTHER): Admitting: Ophthalmology

## 2024-06-27 DIAGNOSIS — E113313 Type 2 diabetes mellitus with moderate nonproliferative diabetic retinopathy with macular edema, bilateral: Secondary | ICD-10-CM

## 2024-06-27 DIAGNOSIS — I10 Essential (primary) hypertension: Secondary | ICD-10-CM

## 2024-06-27 DIAGNOSIS — Z7985 Long-term (current) use of injectable non-insulin antidiabetic drugs: Secondary | ICD-10-CM

## 2024-06-27 DIAGNOSIS — H35033 Hypertensive retinopathy, bilateral: Secondary | ICD-10-CM

## 2024-07-04 ENCOUNTER — Ambulatory Visit: Admitting: Endocrinology

## 2024-08-11 ENCOUNTER — Other Ambulatory Visit: Payer: Self-pay | Admitting: Internal Medicine

## 2024-08-11 DIAGNOSIS — E1169 Type 2 diabetes mellitus with other specified complication: Secondary | ICD-10-CM

## 2024-08-20 ENCOUNTER — Other Ambulatory Visit: Payer: Self-pay | Admitting: Internal Medicine

## 2024-08-20 DIAGNOSIS — I10 Essential (primary) hypertension: Secondary | ICD-10-CM

## 2024-08-20 DIAGNOSIS — E1169 Type 2 diabetes mellitus with other specified complication: Secondary | ICD-10-CM

## 2024-08-30 ENCOUNTER — Ambulatory Visit: Admitting: Endocrinology

## 2024-08-30 ENCOUNTER — Other Ambulatory Visit: Payer: Self-pay

## 2024-08-30 ENCOUNTER — Encounter: Payer: Self-pay | Admitting: Endocrinology

## 2024-08-30 ENCOUNTER — Other Ambulatory Visit

## 2024-08-30 ENCOUNTER — Ambulatory Visit: Payer: Self-pay | Admitting: Endocrinology

## 2024-08-30 VITALS — BP 122/92 | HR 96 | Resp 16 | Ht 75.0 in | Wt 305.0 lb

## 2024-08-30 DIAGNOSIS — L97519 Non-pressure chronic ulcer of other part of right foot with unspecified severity: Secondary | ICD-10-CM | POA: Diagnosis not present

## 2024-08-30 DIAGNOSIS — Z794 Long term (current) use of insulin: Secondary | ICD-10-CM | POA: Diagnosis not present

## 2024-08-30 DIAGNOSIS — I10 Essential (primary) hypertension: Secondary | ICD-10-CM | POA: Diagnosis not present

## 2024-08-30 DIAGNOSIS — E785 Hyperlipidemia, unspecified: Secondary | ICD-10-CM | POA: Diagnosis not present

## 2024-08-30 DIAGNOSIS — E1169 Type 2 diabetes mellitus with other specified complication: Secondary | ICD-10-CM

## 2024-08-30 DIAGNOSIS — E11621 Type 2 diabetes mellitus with foot ulcer: Secondary | ICD-10-CM | POA: Diagnosis not present

## 2024-08-30 DIAGNOSIS — E1165 Type 2 diabetes mellitus with hyperglycemia: Secondary | ICD-10-CM

## 2024-08-30 LAB — POCT GLYCOSYLATED HEMOGLOBIN (HGB A1C): HbA1c POC (<> result, manual entry): >15 %

## 2024-08-30 LAB — SEDIMENTATION RATE: Sed Rate: 19 mm/h — ABNORMAL HIGH (ref 0–15)

## 2024-08-30 MED ORDER — LANTUS SOLOSTAR 100 UNIT/ML ~~LOC~~ SOPN: 35.0000 [IU] | PEN_INJECTOR | Freq: Every day | SUBCUTANEOUS | 3 refills | Status: AC

## 2024-08-30 MED ORDER — INSULIN PEN NEEDLE 32G X 4 MM MISC: 3 refills | Status: AC

## 2024-08-30 MED ORDER — EZETIMIBE 10 MG PO TABS: 10.0000 mg | ORAL_TABLET | Freq: Every day | ORAL | 3 refills | Status: AC

## 2024-08-30 MED ORDER — GLIMEPIRIDE 4 MG PO TABS: 4.0000 mg | ORAL_TABLET | Freq: Every day | ORAL | 3 refills | Status: AC

## 2024-08-30 MED ORDER — TIRZEPATIDE 5 MG/0.5ML ~~LOC~~ SOAJ: 5.0000 mg | SUBCUTANEOUS | 3 refills | Status: AC

## 2024-08-30 MED ORDER — NOVOLOG FLEXPEN 100 UNIT/ML ~~LOC~~ SOPN: 12.0000 [IU] | PEN_INJECTOR | Freq: Three times a day (TID) | SUBCUTANEOUS | 3 refills | Status: DC

## 2024-08-30 MED ORDER — ATORVASTATIN CALCIUM 80 MG PO TABS: 80.0000 mg | ORAL_TABLET | Freq: Every day | ORAL | 3 refills | Status: AC

## 2024-08-30 MED ORDER — DEXCOM G7 SENSOR MISC: 1.0000 | 3 refills | Status: AC

## 2024-08-30 MED ORDER — VALSARTAN-HYDROCHLOROTHIAZIDE 160-25 MG PO TABS: 1.0000 | ORAL_TABLET | Freq: Every day | ORAL | 3 refills | Status: AC

## 2024-08-30 NOTE — Patient Instructions (Signed)
 Diabetes regimen  Start Lantus  35 units daily in the morning.  Start NovoLog  12 units with meals 3 times a day, take before eating.  Resume Mounjaro  5 mg weekly.  Continue glimepiride  4 mg daily.  I have sent prescription for Dexcom G7 continuous glucose monitor.   I have sent referral to foot doctor for your right foot ulcer.  Will contact you.

## 2024-08-30 NOTE — Progress Notes (Signed)
 "  Outpatient Endocrinology Note Mekesha Solomon, MD   Patient's Name: Peter Powell    DOB: 05-Jan-1986    MRN: [id number]                                                    REASON OF VISIT: Follow-up for type 2 diabetes mellitus  REFERRING PROVIDER: Theophilus Andrews, Tully GRADE, MD   PCP: Theophilus Andrews, Tully GRADE, MD  HISTORY OF PRESENT ILLNESS:   Peter Powell is a 39 y.o. old male with past medical history listed below, is here for follow-up for type 2 diabetes mellitus.   Pertinent Diabetes History: Patient was referred to endocrinology for evaluation and management of uncontrolled type 2 diabetes mellitus, initial consult in November 16, 2023.    He was diagnosed with type 2 diabetes mellitus in January 2015, hemoglobin A1c at the time of diagnosis was 11.3%.  He has uncontrolled type 2 diabetes mellitus with hemoglobin A1c in the range of 11.8 to 12.7% range.  Patient reports he was not taking care of his health for a long time in the past.  He had been on metformin  in the past.  He does not recall side effect related to metformin .  He was on Ozempic  in the past started in June 2024 and switch to Mounjaro  in December 2024.  No history of diabetes ketoacidosis.  Previous diabetes education: No  No personal history of pancreatitis and / or family history of medullary thyroid carcinoma or MEN 2B syndrome.   Chronic Diabetes Complications : Retinopathy: yes. Last ophthalmology exam was done on 09/2023, following with ophthalmology regularly.  He has moderate nonproliferative diabetic retinopathy of both eyes with macular edema. Nephropathy: no, on ACE/ARB /valsartan . Peripheral neuropathy: Yes, Diabetic foot ulcer + Coronary artery disease: no Stroke: on  Relevant comorbidities and cardiovascular risk factors: Obesity: yes Body mass index is 38.12 kg/m.  Hypertension: Yes  Hyperlipidemia : Yes, on statin   Current / Home Diabetic regimen includes:  Mounjaro  10 mg weekly, not taking since  end of November 2025. Glimepiride  4 mg daily.  Prior diabetic medications: Metformin  and Ozempic .  Patient does not recall side effect related to metformin  and Ozempic  in the past.  Glycemic data:   He has not been checking blood sugar at home.  Hypoglycemia: Patient has no hypoglycemic episodes. Patient has hypoglycemia awareness.   Factors modifying glucose control: 1.  Diabetic diet assessment: 2-3 meals a day.  Rice, sandwiches.  Stop drinking regular sodas.  2.  Staying active or exercising: Walking.  3.  Medication compliance: compliant all of the time.  Interval history  Patient was last seen in April 2025.  Hemoglobin A1c more than 15% today.  He has not been taking Mounjaro  for several weeks.  He reports he has been taking only glimepiride .  He has noticed wound on right great toe since summer, gradually getting worse, has diabetic foot ulcer.  He did not seek medical attention.  Denies fever and pain on the foot.  Diabetic foot exam and picture as follows.  REVIEW OF SYSTEMS As per history of present illness.   PAST MEDICAL HISTORY: Past Medical History:  Diagnosis Date   DM (diabetes mellitus), type 2 (HCC)    Hypertension    Morbid obesity (HCC)     PAST SURGICAL HISTORY: Past Surgical History:  Procedure  Laterality Date   KNEE SURGERY Right 2001   screws placed below kneecap    ALLERGIES: No Known Allergies  FAMILY HISTORY:  History reviewed. No pertinent family history.  SOCIAL HISTORY: Social History   Socioeconomic History   Marital status: Single    Spouse name: Not on file   Number of children: Not on file   Years of education: Not on file   Highest education level: Some college, no degree  Occupational History   Not on file  Tobacco Use   Smoking status: Never    Passive exposure: Past   Smokeless tobacco: Never  Vaping Use   Vaping status: Never Used  Substance and Sexual Activity   Alcohol use: Yes    Alcohol/week: 6.0 standard  drinks of alcohol    Types: 6 Standard drinks or equivalent per week    Comment: occasional   Drug use: Yes    Types: Marijuana   Sexual activity: Not on file  Other Topics Concern   Not on file  Social History Narrative   Not on file   Social Drivers of Health   Tobacco Use: Low Risk (08/31/2024)   Patient History    Smoking Tobacco Use: Never    Smokeless Tobacco Use: Never    Passive Exposure: Past  Financial Resource Strain: Low Risk (07/21/2023)   Overall Financial Resource Strain (CARDIA)    Difficulty of Paying Living Expenses: Not hard at all  Food Insecurity: No Food Insecurity (07/21/2023)   Hunger Vital Sign    Worried About Running Out of Food in the Last Year: Never true    Ran Out of Food in the Last Year: Never true  Transportation Needs: No Transportation Needs (07/21/2023)   PRAPARE - Administrator, Civil Service (Medical): No    Lack of Transportation (Non-Medical): No  Physical Activity: Insufficiently Active (07/21/2023)   Exercise Vital Sign    Days of Exercise per Week: 3 days    Minutes of Exercise per Session: 20 min  Stress: No Stress Concern Present (07/21/2023)   [employer] of Occupational Health - Occupational Stress Questionnaire    Feeling of Stress : Not at all  Social Connections: Moderately Isolated (07/21/2023)   Social Connection and Isolation Panel    Frequency of Communication with Friends and Family: Three times a week    Frequency of Social Gatherings with Friends and Family: Once a week    Attends Religious Services: 1 to 4 times per year    Active Member of Clubs or Organizations: No    Attends Engineer, Structural: Not on file    Marital Status: Never married  Depression (PHQ2-9): Low Risk (07/22/2023)   Depression (PHQ2-9)    PHQ-2 Score: 0  Alcohol Screen: Low Risk (07/21/2023)   Alcohol Screen    Last Alcohol Screening Score (AUDIT): 1  Housing: Low Risk (07/21/2023)   Housing    Last Housing Risk  Score: 0  Utilities: Not on file  Health Literacy: Not on file    MEDICATIONS:  Current Outpatient Medications  Medication Sig Dispense Refill   amLODipine  (NORVASC ) 5 MG tablet TAKE 1 TABLET(5 MG) BY MOUTH DAILY 90 tablet 1   Blood Glucose Monitoring Suppl DEVI 1 each by Does not apply route in the morning, at noon, and at bedtime. May substitute to any manufacturer covered by patient's insurance. 1 each 0   Continuous Glucose Sensor (DEXCOM G7 SENSOR) MISC 1 Device by Does not apply route continuous.  9 each 3   insulin  glargine (LANTUS  SOLOSTAR) 100 UNIT/ML Solostar Pen Inject 35 Units into the skin daily. 30 mL 3   Insulin  Pen Needle 32G X 4 MM MISC Use 4x a day 300 each 3   tirzepatide  (MOUNJARO ) 5 MG/0.5ML Pen Inject 5 mg into the skin once a week. 6 mL 3   atorvastatin  (LIPITOR) 80 MG tablet Take 1 tablet (80 mg total) by mouth daily. 90 tablet 3   ezetimibe  (ZETIA ) 10 MG tablet Take 1 tablet (10 mg total) by mouth daily. 90 tablet 3   glimepiride  (AMARYL ) 4 MG tablet Take 1 tablet (4 mg total) by mouth daily before breakfast. 90 tablet 3   insulin  aspart (NOVOLOG  FLEXPEN) 100 UNIT/ML FlexPen Inject 12 Units into the skin 3 (three) times daily with meals. 30 mL 3   valsartan -hydrochlorothiazide  (DIOVAN -HCT) 160-25 MG tablet Take 1 tablet by mouth daily. 90 tablet 3   No current facility-administered medications for this visit.    PHYSICAL EXAM: Vitals:   08/30/24 1128  BP: (!) 122/92  Pulse: 96  Resp: 16  SpO2: 99%  Weight: (!) 305 lb (138.3 kg)  Height: 6' 3 (1.905 m)    Body mass index is 38.12 kg/m.  Wt Readings from Last 3 Encounters:  08/30/24 (!) 305 lb (138.3 kg)  11/16/23 293 lb (132.9 kg)  07/22/23 291 lb 14.4 oz (132.4 kg)    General: Well developed, well nourished male in no apparent distress.  HEENT: AT/[place], no external lesions.  Eyes: Conjunctiva clear and no icterus. Neck: Neck supple  Lungs: Respirations not labored Neurologic: Alert, oriented,  normal speech Extremities / Skin: Dry. No sores or rashes noted.  Psychiatric: Does not appear depressed or anxious   Diabetic Foot Exam - Simple   Simple Foot Form Diabetic Foot exam was performed with the following findings: Yes 08/30/2024 11:33 AM  Visual Inspection See comments: Yes Sensation Testing See comments: Yes Pulse Check Posterior Tibialis and Dorsalis pulse intact bilaterally: Yes Comments Monofilament sensory exam significantly diminished bilaterally. Right great toe ulcer ~3 cm, erythematous no discharge.      LABS Reviewed Lab Results  Component Value Date   HGBA1C >15 08/30/2024   HGBA1C 11.1 (A) 11/16/2023   HGBA1C 12.5 (A) 07/22/2023   No results found for: FRUCTOSAMINE Lab Results  Component Value Date   CHOL 177 08/30/2024   HDL 31 (L) 08/30/2024   LDLCALC 117 (H) 08/30/2024   TRIG 169 (H) 08/30/2024   CHOLHDL 5.7 (H) 08/30/2024   Lab Results  Component Value Date   MICRALBCREAT 67 (H) 08/30/2024    Lab Results  Component Value Date   CREATININE 1.20 08/30/2024   Lab Results  Component Value Date   GFR 100.34 07/22/2023    ASSESSMENT / PLAN  1. Uncontrolled type 2 diabetes mellitus with hyperglycemia, with long-term current use of insulin  (HCC)   2. Hyperlipidemia associated with type 2 diabetes mellitus (HCC)   3. Primary hypertension   4. Diabetic ulcer of other part of right foot associated with type 2 diabetes mellitus, unspecified ulcer stage (HCC)     Diabetes Mellitus type 2, complicated by diabetic retinopathy /diabetic neuropathy/diabetic foot ulcer. - Diabetic status / severity: Uncontrolled, worsening.  Lab Results  Component Value Date   HGBA1C >15 08/30/2024    - Hemoglobin A1c goal : <6.5%  Patient has worsening diabetes control, poorly controlled with hemoglobin A1c more than 15%.  No glucose data available to review.  He has not  been taking Mounjaro  for several months and he has only been taking glimepiride .   Discussed in detail about compliance medications and follow-up.  Discussed about importance of controlling blood sugar to prevent chronic diabetic complications.  He has new diabetic foot ulcer on the right foot as noted above possibly infected.  Patient needs multidose insulin  regimen.  - Medications: See below.  I) Start Lantus  35 units daily in the morning.  II) Start NovoLog  12 units with meals 3 times a day, take before eating.  III) Resume Mounjaro  5 mg weekly.  IV) Continue glimepiride  4 mg daily.  - Home glucose testing: Check blood sugar in the morning fasting and at bedtime.  Sent prescription for Dexcom G7 to use CGM.  - Discussed/ Gave Hypoglycemia treatment plan.  # Consult : Refer to diabetic educator/dietitian.  # Annual urine for microalbuminuria/ creatinine ratio, + microalbuminuria currently, continue ACE/ARB /valsartan . Last  Lab Results  Component Value Date   MICRALBCREAT 67 (H) 08/30/2024     # Foot check nightly / neuropathy.  He has new diabetic foot ulcer on right foot, possibly infected.  Concern for osteomyelitis.  Sent urgent referral to podiatry. - Will check CBC, ESR and CRP  #.  Diabetic retinopathy, following with ophthalmology.  - Diet: Make healthy diabetic food choices, discussed in detail. - Life style / activity / exercise: Discussed.  2. Blood pressure  -  BP Readings from Last 1 Encounters:  08/30/24 (!) 122/92    - Control is in target.  - No change in current plans.  3. Lipid status / Hyperlipidemia - Last  Lab Results  Component Value Date   LDLCALC 117 (H) 08/30/2024   - Continue atorvastatin  80 mg daily, managed by PCP.  Not compliant.  Will check lipid panel today.  Diagnoses and all orders for this visit:  Uncontrolled type 2 diabetes mellitus with hyperglycemia, with long-term current use of insulin  (HCC) -     POCT glycosylated hemoglobin (Hb A1C) -     tirzepatide  (MOUNJARO ) 5 MG/0.5ML Pen; Inject 5 mg into  the skin once a week. -     insulin  glargine (LANTUS  SOLOSTAR) 100 UNIT/ML Solostar Pen; Inject 35 Units into the skin daily. -     glimepiride  (AMARYL ) 4 MG tablet; Take 1 tablet (4 mg total) by mouth daily before breakfast. -     Continuous Glucose Sensor (DEXCOM G7 SENSOR) MISC; 1 Device by Does not apply route continuous. -     Discontinue: insulin  aspart (NOVOLOG  FLEXPEN) 100 UNIT/ML FlexPen; Inject 12 Units into the skin 3 (three) times daily with meals. -     Insulin  Pen Needle 32G X 4 MM MISC; Use 4x a day -     Comprehensive metabolic panel with GFR -     CBC with Differential/Platelet -     Microalbumin / creatinine urine ratio -     Ambulatory referral to Podiatry  Hyperlipidemia associated with type 2 diabetes mellitus (HCC) -     atorvastatin  (LIPITOR) 80 MG tablet; Take 1 tablet (80 mg total) by mouth daily. -     ezetimibe  (ZETIA ) 10 MG tablet; Take 1 tablet (10 mg total) by mouth daily. -     Lipid panel  Primary hypertension -     valsartan -hydrochlorothiazide  (DIOVAN -HCT) 160-25 MG tablet; Take 1 tablet by mouth daily.  Diabetic ulcer of other part of right foot associated with type 2 diabetes mellitus, unspecified ulcer stage (HCC) -     CBC with  Differential/Platelet -     C-reactive protein -     Sedimentation rate -     Ambulatory referral to Podiatry     DISPOSITION Follow up in clinic in 6 weeks suggested.  Labs today as ordered.   All questions answered and patient verbalized understanding of the plan.  Heber Hoog, MD [facility] [address] [place], [address] [place], [name] [address] Phone # [phone]  At least part of this note was generated using voice recognition software. Inadvertent word errors may have occurred, which were not recognized during the proofreading process. "

## 2024-08-31 ENCOUNTER — Encounter: Payer: Self-pay | Admitting: Endocrinology

## 2024-08-31 ENCOUNTER — Other Ambulatory Visit: Payer: Self-pay

## 2024-08-31 DIAGNOSIS — E1165 Type 2 diabetes mellitus with hyperglycemia: Secondary | ICD-10-CM

## 2024-08-31 LAB — CBC WITH DIFFERENTIAL/PLATELET
Absolute Lymphocytes: 2070 {cells}/uL (ref 850–3900)
Absolute Monocytes: 371 {cells}/uL (ref 200–950)
Basophils Absolute: 47 {cells}/uL (ref 0–200)
Basophils Relative: 0.6 %
Eosinophils Absolute: 174 {cells}/uL (ref 15–500)
Eosinophils Relative: 2.2 %
HCT: 50.5 % (ref 39.4–51.1)
Hemoglobin: 15 g/dL (ref 13.2–17.1)
MCH: 22 pg — ABNORMAL LOW (ref 27.0–33.0)
MCHC: 29.7 g/dL — ABNORMAL LOW (ref 31.6–35.4)
MCV: 74 fL — ABNORMAL LOW (ref 81.4–101.7)
MPV: 12 fL (ref 7.5–12.5)
Monocytes Relative: 4.7 %
Neutro Abs: 5238 {cells}/uL (ref 1500–7800)
Neutrophils Relative %: 66.3 %
Platelets: 259 Thousand/uL (ref 140–400)
RBC: 6.82 Million/uL — ABNORMAL HIGH (ref 4.20–5.80)
RDW: 13.9 % (ref 11.0–15.0)
Total Lymphocyte: 26.2 %
WBC: 7.9 Thousand/uL (ref 3.8–10.8)

## 2024-08-31 LAB — COMPREHENSIVE METABOLIC PANEL WITH GFR
AG Ratio: 1.3 (calc) (ref 1.0–2.5)
ALT: 30 U/L (ref 9–46)
AST: 19 U/L (ref 10–40)
Albumin: 4.4 g/dL (ref 3.6–5.1)
Alkaline phosphatase (APISO): 93 U/L (ref 36–130)
BUN: 23 mg/dL (ref 7–25)
CO2: 30 mmol/L (ref 20–32)
Calcium: 10.1 mg/dL (ref 8.6–10.3)
Chloride: 94 mmol/L — ABNORMAL LOW (ref 98–110)
Creat: 1.2 mg/dL (ref 0.60–1.26)
Globulin: 3.5 g/dL (ref 1.9–3.7)
Glucose, Bld: 454 mg/dL — ABNORMAL HIGH (ref 65–139)
Potassium: 4.9 mmol/L (ref 3.5–5.3)
Sodium: 135 mmol/L (ref 135–146)
Total Bilirubin: 0.6 mg/dL (ref 0.2–1.2)
Total Protein: 7.9 g/dL (ref 6.1–8.1)
eGFR: 79 mL/min/1.73m2

## 2024-08-31 LAB — MICROALBUMIN / CREATININE URINE RATIO
Creatinine, Urine: 79 mg/dL (ref 20–320)
Microalb Creat Ratio: 67 mg/g{creat} — ABNORMAL HIGH
Microalb, Ur: 5.3 mg/dL

## 2024-08-31 LAB — LIPID PANEL
Cholesterol: 177 mg/dL
HDL: 31 mg/dL — ABNORMAL LOW
LDL Cholesterol (Calc): 117 mg/dL — ABNORMAL HIGH
Non-HDL Cholesterol (Calc): 146 mg/dL — ABNORMAL HIGH
Total CHOL/HDL Ratio: 5.7 (calc) — ABNORMAL HIGH
Triglycerides: 169 mg/dL — ABNORMAL HIGH

## 2024-08-31 LAB — C-REACTIVE PROTEIN: CRP: 8.4 mg/L — ABNORMAL HIGH

## 2024-08-31 MED ORDER — INSULIN LISPRO (1 UNIT DIAL) 100 UNIT/ML (KWIKPEN): PEN_INJECTOR | SUBCUTANEOUS | 3 refills | Status: AC

## 2024-09-05 ENCOUNTER — Ambulatory Visit: Admitting: Podiatry

## 2024-09-05 ENCOUNTER — Ambulatory Visit: Admitting: Internal Medicine

## 2024-09-05 ENCOUNTER — Encounter: Payer: Self-pay | Admitting: Podiatry

## 2024-09-05 ENCOUNTER — Ambulatory Visit

## 2024-09-05 ENCOUNTER — Other Ambulatory Visit (HOSPITAL_COMMUNITY): Payer: Self-pay

## 2024-09-05 ENCOUNTER — Telehealth: Payer: Self-pay | Admitting: Pharmacy Technician

## 2024-09-05 DIAGNOSIS — E08621 Diabetes mellitus due to underlying condition with foot ulcer: Secondary | ICD-10-CM

## 2024-09-05 DIAGNOSIS — L97511 Non-pressure chronic ulcer of other part of right foot limited to breakdown of skin: Secondary | ICD-10-CM | POA: Diagnosis not present

## 2024-09-05 DIAGNOSIS — L97512 Non-pressure chronic ulcer of other part of right foot with fat layer exposed: Secondary | ICD-10-CM | POA: Diagnosis not present

## 2024-09-05 MED ORDER — MUPIROCIN 2 % EX OINT: 1.0000 | TOPICAL_OINTMENT | Freq: Two times a day (BID) | CUTANEOUS | 0 refills | Status: AC

## 2024-09-05 NOTE — Telephone Encounter (Signed)
 Pharmacy Patient Advocate Encounter   Received notification from [facility] KEY that prior authorization for Dexcom G7 Sensor  is required/requested.   Insurance verification completed.   The patient is insured through [facility].   Per test claim: PA required; PA submitted to above mentioned insurance via Latent Key/confirmation #/EOC [id number] Status is pending

## 2024-09-05 NOTE — Progress Notes (Signed)
 "  Subjective:  Patient ID: Peter Powell, male    DOB: 02/12/1986,  MRN: [id number] HPI Chief Complaint  Patient presents with   Foot Ulcer    Sub 1st MPJ right - ulceration x 8 months, started as a scrape on the skin last summer, tried to treat himself but worsened, been seeing endocrinologist, last A1c was 15.0, on insulin  now, using bandaid to cover, no ointments or antibiotics   New Patient (Initial Visit)    39 y.o. male presents with the above complaint.   ROS: Denies fever chills nausea vomiting muscle aches pains calf pain back pain chest pain shortness of breath.  Past Medical History:  Diagnosis Date   DM (diabetes mellitus), type 2 (HCC)    Hypertension    Morbid obesity (HCC)    Past Surgical History:  Procedure Laterality Date   KNEE SURGERY Right 2001   screws placed below kneecap   Current Medications[1]  Allergies[2] Review of Systems Objective:  There were no vitals filed for this visit.  General: Well developed, nourished, in no acute distress, alert and oriented x3   Dermatological: Skin is warm, dry and supple bilateral. Nails x 10 are well maintained; remaining integument appears unremarkable at this time.  Large ulcerative lesion to medial aspect of the first metatarsal phalangeal joint.  This measures 3.2 cm in diameter.  The lesion goes down to fat.  Does not demonstrate any purulence or drainage.  Large granulomatous easily friable tissue attached to the dorsal margin of the lesion.  There was no probing deep.  vascular: Dorsalis Pedis artery and Posterior Tibial artery pedal pulses are 2/4 bilateral with immedate capillary fill time. Pedal hair growth present. No varicosities and no lower extremity edema present bilateral.   Neruologic: Grossly intact via light touch bilateral. Vibratory intact via tuning fork bilateral. Protective threshold with Semmes Wienstein monofilament intact to all pedal sites bilateral. Patellar and Achilles deep tendon reflexes 2+  bilateral. No Babinski or clonus noted bilateral.   Musculoskeletal: No gross boney pedal deformities bilateral. No pain, crepitus, or limitation noted with foot and ankle range of motion bilateral. Muscular strength 5/5 in all groups tested bilateral.  Gait: Unassisted, Nonantalgic.    Radiographs:  Radiographs taken today demonstrate osseously mature individual no significant signs of bone destruction of the first metatarsal phalangeal joint area or sesamoidal apparatus.  Does demonstrate soft tissue breakdown easily visible.  Assessment & Plan:   Assessment: Severe uncontrolled diabetes mellitus with diabetic peripheral neuropathy and diabetic ulceration.  Plan: Significant debridement of the wound today to bleeding.  Placed Surgicel and Surgifoam today to help control bleeding also used silver nitrate.  Sharp debridement with scalpel down to fat layer minimally increasing the size of the wound to approximately 4 cm in diameter.  Dressed or compressive dressing was applied.  Patient was given strict instructions to keep this clean and dry I did prescribe Bactroban  ointment to be applied daily until he can follow-up next week with Dr. Silva.  Patient is leaving for [place]  the following week.     Judithe Keetch T. Norvella Loscalzo, DPM    [1]  Current Outpatient Medications:    amLODipine  (NORVASC ) 5 MG tablet, TAKE 1 TABLET(5 MG) BY MOUTH DAILY, Disp: 90 tablet, Rfl: 1   Blood Glucose Monitoring Suppl DEVI, 1 each by Does not apply route in the morning, at noon, and at bedtime. May substitute to any manufacturer covered by patient's insurance., Disp: 1 each, Rfl: 0   mupirocin  ointment (BACTROBAN ) 2 %,  Apply 1 Application topically 2 (two) times daily., Disp: 22 g, Rfl: 0   atorvastatin  (LIPITOR) 80 MG tablet, Take 1 tablet (80 mg total) by mouth daily., Disp: 90 tablet, Rfl: 3   Continuous Glucose Sensor (DEXCOM G7 SENSOR) MISC, 1 Device by Does not apply route continuous., Disp: 9 each, Rfl: 3    ezetimibe  (ZETIA ) 10 MG tablet, Take 1 tablet (10 mg total) by mouth daily., Disp: 90 tablet, Rfl: 3   glimepiride  (AMARYL ) 4 MG tablet, Take 1 tablet (4 mg total) by mouth daily before breakfast., Disp: 90 tablet, Rfl: 3   insulin  glargine (LANTUS  SOLOSTAR) 100 UNIT/ML Solostar Pen, Inject 35 Units into the skin daily., Disp: 30 mL, Rfl: 3   insulin  lispro (HUMALOG  KWIKPEN) 100 UNIT/ML KwikPen, Inject 12 Units into the skin 3 (three) times daily with meals, Disp: 30 mL, Rfl: 3   Insulin  Pen Needle 32G X 4 MM MISC, Use 4x a day, Disp: 300 each, Rfl: 3   tirzepatide  (MOUNJARO ) 5 MG/0.5ML Pen, Inject 5 mg into the skin once a week., Disp: 6 mL, Rfl: 3   valsartan -hydrochlorothiazide  (DIOVAN -HCT) 160-25 MG tablet, Take 1 tablet by mouth daily., Disp: 90 tablet, Rfl: 3 [2] No Known Allergies  "

## 2024-09-06 ENCOUNTER — Other Ambulatory Visit (HOSPITAL_COMMUNITY): Payer: Self-pay

## 2024-09-06 NOTE — Telephone Encounter (Signed)
 Pharmacy Patient Advocate Encounter  Received notification from [employer] that Prior Authorization for Dexcom G7 Sensor  has been APPROVED from 09/05/24 to 09/05/25. Ran test claim, Copay is $75.00. This test claim was processed through [facility], [facility], [facility]- copay amounts may vary at other pharmacies due to pharmacy/plan contracts, or as the patient moves through the different stages of their insurance plan.   PA #/Case ID/Reference #: [id number]

## 2024-09-12 ENCOUNTER — Ambulatory Visit: Admitting: Podiatry

## 2024-09-12 ENCOUNTER — Encounter: Payer: Self-pay | Admitting: Podiatry

## 2024-09-12 VITALS — Ht 75.0 in | Wt 305.0 lb

## 2024-09-12 DIAGNOSIS — E11621 Type 2 diabetes mellitus with foot ulcer: Secondary | ICD-10-CM | POA: Diagnosis not present

## 2024-09-12 DIAGNOSIS — L97412 Non-pressure chronic ulcer of right heel and midfoot with fat layer exposed: Secondary | ICD-10-CM

## 2024-09-12 NOTE — Progress Notes (Signed)
"  °  Subjective:  Patient ID: Peter Powell, male    DOB: [birth year],  MRN: [id number]  Chief Complaint  Patient presents with   Diabetic Ulcer    Rm 1 Patient is here for diabetic ulcer of the right foot. Ulcer is open with moderate drainage. Pt states changing dressing twice daily.    39 y.o. male presents with the above complaint. History confirmed with patient.  He presented for ongoing wound care of the diabetic foot ulcer of the right foot ongoing for few weeks now.  He is utilizing mupirocin  ointment.  Recently establish care and is undergoing treatment for his diabetes his A1c a few weeks ago was over 15%.  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  Objective:  Physical Exam: Pulses are palpable he has diffuse polyneuropathy, full-thickness plantar submetatarsal 1 ulcer on the right foot and small fissure lateral to this main ulceration measures 3.0 x 2.5 x 0.3 cm.  Large amount of exposed subcutaneous tissue granular wound bed and large amount of hyperkeratosis    Radiographs: Multiple views x-ray of the right foot: Prior right foot radiographs taken at the last visit 1 week ago with Dr. Verta shows no osteomyelitis or gas-forming infection Assessment:     ICD-10-CM   1. Diabetic ulcer of right midfoot associated with type 2 diabetes mellitus, with fat layer exposed [facility])  Z88.378    L97.412         Plan:  Patient was evaluated and treated and all questions answered.  We discussed etiology treatment options diabetic foot ulceration discussed with him the major impact factor impeding his progression of healing is his A1c which is over 15% and encouraged her glycemic control and working on his A1c to be lowered as much as possible.  Also recommend offloading with a surgical shoe and peg assist device that this was dispensed today, to be utilized weightbearing as tolerated to offload the ulceration.  Continue mupirocin  application daily.  Follow-up with me in 3  weeks to reevaluate and for ongoing wound care hopefully she will transition to collagen dressings at that point.   Excisional Debridement Note   Indication/Diagnosis: Diabetic foot ulcer right foot Frequency: 09/12/2024 (Initial Debridement), followed by every 2 to 3 weeks Response to prior treatment: N/A- initial debridement  Pre-Procedure Wound Assessment: Wound Location: Right foot submetatarsal 1 Size (cm): L 2.0 x W 2.5 x D 0.3 Infection: absent Necrotic/Devitalized Tissue:present- 20%, hyperkeratosis Healing Barriers: biofilm and hyperkeratosis, offloading   Procedure: Verbal consent given by patient, and correct patient, procedure, and site were identified prior to beginning the procedure.  Type: Sharp Tool Used: scalpel Debridement depth beyond dead/damaged tissue down to healthy viable tissue: yes Deepest layer of tissue removed: subcutaneous tissue    Nature of tissue removed: Slough and Devitalized Tissue Irrigation fluid type: Normal Saline Irrigation volume (mL): 5 Blood Loss: minimal   Post-Procedure: Size After (cm): L 2.5 x W 3.0 x D 0.3   Plan:  Continue mupirocin  ointment applied daily, dressing with saline wet-to-dry dressings.  Follow-up in 3 weeks for further debridement and reevaluation hopefully transition to collagen dressings at this point.   Return in about 3 weeks (around 10/03/2024) for wound care.   "

## 2024-10-03 ENCOUNTER — Ambulatory Visit: Admitting: Podiatry

## 2024-10-17 ENCOUNTER — Ambulatory Visit: Admitting: Endocrinology

## 2024-12-07 ENCOUNTER — Encounter: Admitting: Internal Medicine
